# Patient Record
Sex: Female | Born: 1950 | Race: Black or African American | Hispanic: No | Marital: Single | Smoking: Former smoker
Health system: Southern US, Community
[De-identification: ages and names within clinical notes are randomized; demographics above are authoritative.]

## PROBLEM LIST (undated history)

## (undated) DIAGNOSIS — R42 Dizziness and giddiness: Secondary | ICD-10-CM

## (undated) DIAGNOSIS — E119 Type 2 diabetes mellitus without complications: Secondary | ICD-10-CM

## (undated) DIAGNOSIS — I10 Essential (primary) hypertension: Secondary | ICD-10-CM

## (undated) DIAGNOSIS — R062 Wheezing: Secondary | ICD-10-CM

## (undated) DIAGNOSIS — E785 Hyperlipidemia, unspecified: Secondary | ICD-10-CM

## (undated) DIAGNOSIS — J449 Chronic obstructive pulmonary disease, unspecified: Secondary | ICD-10-CM

## (undated) DIAGNOSIS — E079 Disorder of thyroid, unspecified: Secondary | ICD-10-CM

## (undated) DIAGNOSIS — R0600 Dyspnea, unspecified: Secondary | ICD-10-CM

## (undated) DIAGNOSIS — E039 Hypothyroidism, unspecified: Secondary | ICD-10-CM

## (undated) DIAGNOSIS — R011 Cardiac murmur, unspecified: Secondary | ICD-10-CM

## (undated) HISTORY — PX: FRACTURE SURGERY: SHX138

---

## 2016-05-18 ENCOUNTER — Emergency Department
Admission: EM | Admit: 2016-05-18 | Discharge: 2016-05-18 | Disposition: A | Discharge status: Home / Self Care | Payer: BC Managed Care – PPO | Attending: Emergency Medicine | Admitting: Emergency Medicine

## 2016-05-18 ENCOUNTER — Emergency Department: Payer: BC Managed Care – PPO

## 2016-05-18 DIAGNOSIS — R05 Cough: Secondary | ICD-10-CM | POA: Diagnosis present

## 2016-05-18 DIAGNOSIS — H81392 Other peripheral vertigo, left ear: Secondary | ICD-10-CM | POA: Diagnosis not present

## 2016-05-18 DIAGNOSIS — E785 Hyperlipidemia, unspecified: Secondary | ICD-10-CM | POA: Insufficient documentation

## 2016-05-18 DIAGNOSIS — J209 Acute bronchitis, unspecified: Secondary | ICD-10-CM | POA: Diagnosis not present

## 2016-05-18 DIAGNOSIS — I10 Essential (primary) hypertension: Secondary | ICD-10-CM | POA: Insufficient documentation

## 2016-05-18 DIAGNOSIS — E119 Type 2 diabetes mellitus without complications: Secondary | ICD-10-CM | POA: Insufficient documentation

## 2016-05-18 HISTORY — DX: Essential (primary) hypertension: I10

## 2016-05-18 HISTORY — DX: Hyperlipidemia, unspecified: E78.5

## 2016-05-18 HISTORY — DX: Disorder of thyroid, unspecified: E07.9

## 2016-05-18 HISTORY — DX: Type 2 diabetes mellitus without complications: E11.9

## 2016-05-18 LAB — URINALYSIS COMPLETE WITH MICROSCOPIC (ARMC ONLY)
Bilirubin Urine: NEGATIVE
GLUCOSE, UA: NEGATIVE mg/dL
HGB URINE DIPSTICK: NEGATIVE
Ketones, ur: NEGATIVE mg/dL
LEUKOCYTES UA: NEGATIVE
Nitrite: NEGATIVE
Protein, ur: NEGATIVE mg/dL
Specific Gravity, Urine: 1.015 (ref 1.005–1.030)
pH: 7 (ref 5.0–8.0)

## 2016-05-18 LAB — CBC
HEMATOCRIT: 36.8 % (ref 35.0–47.0)
Hemoglobin: 11.9 g/dL — ABNORMAL LOW (ref 12.0–16.0)
MCH: 28.4 pg (ref 26.0–34.0)
MCHC: 32.3 g/dL (ref 32.0–36.0)
MCV: 87.7 fL (ref 80.0–100.0)
Platelets: 173 10*3/uL (ref 150–440)
RBC: 4.2 MIL/uL (ref 3.80–5.20)
RDW: 16.2 % — AB (ref 11.5–14.5)
WBC: 5.6 10*3/uL (ref 3.6–11.0)

## 2016-05-18 LAB — BASIC METABOLIC PANEL
Anion gap: 6 (ref 5–15)
BUN: 25 mg/dL — AB (ref 6–20)
CHLORIDE: 105 mmol/L (ref 101–111)
CO2: 28 mmol/L (ref 22–32)
CREATININE: 0.89 mg/dL (ref 0.44–1.00)
Calcium: 9 mg/dL (ref 8.9–10.3)
GFR calc Af Amer: >60 mL/min (ref >60)
GFR calc non Af Amer: >60 mL/min (ref >60)
GLUCOSE: 110 mg/dL — AB (ref 65–99)
POTASSIUM: 3.9 mmol/L (ref 3.5–5.1)
Sodium: 139 mmol/L (ref 135–145)

## 2016-05-18 MED ORDER — ALBUTEROL SULFATE (2.5 MG/3ML) 0.083% IN NEBU
5.0000 mg | INHALATION_SOLUTION | Freq: Once | RESPIRATORY_TRACT | Status: AC
  Administered 2016-05-18: 5 mg via RESPIRATORY_TRACT
  Filled 2016-05-18: qty 6

## 2016-05-18 MED ORDER — MECLIZINE HCL 25 MG PO TABS
50.0000 mg | ORAL_TABLET | Freq: Once | ORAL | Status: AC
  Administered 2016-05-18: 50 mg via ORAL
  Filled 2016-05-18: qty 2

## 2016-05-18 MED ORDER — IPRATROPIUM-ALBUTEROL 0.5-2.5 (3) MG/3ML IN SOLN
3.0000 mL | Freq: Once | RESPIRATORY_TRACT | Status: AC
  Administered 2016-05-18: 3 mL via RESPIRATORY_TRACT
  Filled 2016-05-18: qty 3

## 2016-05-18 MED ORDER — AZITHROMYCIN 250 MG PO TABS: ORAL_TABLET | ORAL | Status: DC

## 2016-05-18 MED ORDER — MECLIZINE HCL 25 MG PO TABS: 25.0000 mg | ORAL_TABLET | Freq: Three times a day (TID) | ORAL | Status: DC | PRN

## 2016-05-18 MED ORDER — AZITHROMYCIN 500 MG PO TABS
500.0000 mg | ORAL_TABLET | Freq: Once | ORAL | Status: AC
Start: 2016-05-18 — End: 2016-05-18
  Administered 2016-05-18: 500 mg via ORAL
  Filled 2016-05-18: qty 1

## 2016-05-18 MED ORDER — ALBUTEROL SULFATE (2.5 MG/3ML) 0.083% IN NEBU: 2.5000 mg | INHALATION_SOLUTION | Freq: Four times a day (QID) | RESPIRATORY_TRACT | Status: DC | PRN

## 2016-05-18 NOTE — ED Notes (Signed)
Pt c/o cough/congestion for the past week. Today while getting dressed pt states she had 2 episodes of syncope..denies injury.. States she landing on her bed..Marland Kitchen

## 2016-05-18 NOTE — Discharge Instructions (Signed)
Acute Bronchitis Bronchitis is inflammation of the airways that extend from the windpipe into the lungs (bronchi). The inflammation often causes mucus to develop. This leads to a cough, which is the most common symptom of bronchitis.  In acute bronchitis, the condition usually develops suddenly and goes away over time, usually in a couple weeks. Smoking, allergies, and asthma can make bronchitis worse. Repeated episodes of bronchitis may cause further lung problems.  CAUSES Acute bronchitis is most often caused by the same virus that causes a cold. The virus can spread from person to person (contagious) through coughing, sneezing, and touching contaminated objects. SIGNS AND SYMPTOMS   Cough.   Fever.   Coughing up mucus.   Body aches.   Chest congestion.   Chills.   Shortness of breath.   Sore throat.  DIAGNOSIS  Acute bronchitis is usually diagnosed through a physical exam. Your health care provider will also ask you questions about your medical history. Tests, such as chest X-rays, are sometimes done to rule out other conditions.  TREATMENT  Acute bronchitis usually goes away in a couple weeks. Oftentimes, no medical treatment is necessary. Medicines are sometimes given for relief of fever or cough. Antibiotic medicines are usually not needed but may be prescribed in certain situations. In some cases, an inhaler may be recommended to help reduce shortness of breath and control the cough. A cool mist vaporizer may also be used to help thin bronchial secretions and make it easier to clear the chest.  HOME CARE INSTRUCTIONS  Get plenty of rest.   Drink enough fluids to keep your urine clear or pale yellow (unless you have a medical condition that requires fluid restriction). Increasing fluids may help thin your respiratory secretions (sputum) and reduce chest congestion, and it will prevent dehydration.   Take medicines only as directed by your health care provider.  If  you were prescribed an antibiotic medicine, finish it all even if you start to feel better.  Avoid smoking and secondhand smoke. Exposure to cigarette smoke or irritating chemicals will make bronchitis worse. If you are a smoker, consider using nicotine gum or skin patches to help control withdrawal symptoms. Quitting smoking will help your lungs heal faster.   Reduce the chances of another bout of acute bronchitis by washing your hands frequently, avoiding people with cold symptoms, and trying not to touch your hands to your mouth, nose, or eyes.   Keep all follow-up visits as directed by your health care provider.  SEEK MEDICAL CARE IF: Your symptoms do not improve after 1 week of treatment.  SEEK IMMEDIATE MEDICAL CARE IF:  You develop an increased fever or chills.   You have chest pain.   You have severe shortness of breath.  You have bloody sputum.   You develop dehydration.  You faint or repeatedly feel like you are going to pass out.  You develop repeated vomiting.  You develop a severe headache. MAKE SURE YOU:   Understand these instructions.  Will watch your condition.  Will get help right away if you are not doing well or get worse.   This information is not intended to replace advice given to you by your health care provider. Make sure you discuss any questions you have with your health care provider.   Document Released: 12/23/2004 Document Revised: 12/06/2014 Document Reviewed: 05/08/2013 Elsevier Interactive Patient Education 2016 Elsevier Inc.  Benign Positional Vertigo Vertigo is the feeling that you or your surroundings are moving when they are not.  Benign positional vertigo is the most common form of vertigo. The cause of this condition is not serious (is benign). This condition is triggered by certain movements and positions (is positional). This condition can be dangerous if it occurs while you are doing something that could endanger you or others,  such as driving.  CAUSES In many cases, the cause of this condition is not known. It may be caused by a disturbance in an area of the inner ear that helps your brain to sense movement and balance. This disturbance can be caused by a viral infection (labyrinthitis), head injury, or repetitive motion. RISK FACTORS This condition is more likely to develop in:  Women.  People who are 64 years of age or older. SYMPTOMS Symptoms of this condition usually happen when you move your head or your eyes in different directions. Symptoms may start suddenly, and they usually last for less than a minute. Symptoms may include:  Loss of balance and falling.  Feeling like you are spinning or moving.  Feeling like your surroundings are spinning or moving.  Nausea and vomiting.  Blurred vision.  Dizziness.  Involuntary eye movement (nystagmus). Symptoms can be mild and cause only slight annoyance, or they can be severe and interfere with daily life. Episodes of benign positional vertigo may return (recur) over time, and they may be triggered by certain movements. Symptoms may improve over time. DIAGNOSIS This condition is usually diagnosed by medical history and a physical exam of the head, neck, and ears. You may be referred to a health care provider who specializes in ear, nose, and throat (ENT) problems (otolaryngologist) or a provider who specializes in disorders of the nervous system (neurologist). You may have additional testing, including:  MRI.  A CT scan.  Eye movement tests. Your health care provider may ask you to change positions quickly while he or she watches you for symptoms of benign positional vertigo, such as nystagmus. Eye movement may be tested with an electronystagmogram (ENG), caloric stimulation, the Dix-Hallpike test, or the roll test.  An electroencephalogram (EEG). This records electrical activity in your brain.  Hearing tests. TREATMENT Usually, your health care provider  will treat this by moving your head in specific positions to adjust your inner ear back to normal. Surgery may be needed in severe cases, but this is rare. In some cases, benign positional vertigo may resolve on its own in 2-4 weeks. HOME CARE INSTRUCTIONS Safety  Move slowly.Avoid sudden body or head movements.  Avoid driving.  Avoid operating heavy machinery.  Avoid doing any tasks that would be dangerous to you or others if a vertigo episode would occur.  If you have trouble walking or keeping your balance, try using a cane for stability. If you feel dizzy or unstable, sit down right away.  Return to your normal activities as told by your health care provider. Ask your health care provider what activities are safe for you. General Instructions  Take over-the-counter and prescription medicines only as told by your health care provider.  Avoid certain positions or movements as told by your health care provider.  Drink enough fluid to keep your urine clear or pale yellow.  Keep all follow-up visits as told by your health care provider. This is important. SEEK MEDICAL CARE IF:  You have a fever.  Your condition gets worse or you develop new symptoms.  Your family or friends notice any behavioral changes.  Your nausea or vomiting gets worse.  You have numbness or a "  pins and needles" sensation. SEEK IMMEDIATE MEDICAL CARE IF:  You have difficulty speaking or moving.  You are always dizzy.  You faint.  You develop severe headaches.  You have weakness in your legs or arms.  You have changes in your hearing or vision.  You develop a stiff neck.  You develop sensitivity to light.   This information is not intended to replace advice given to you by your health care provider. Make sure you discuss any questions you have with your health care provider.   Document Released: 08/23/2006 Document Revised: 08/06/2015 Document Reviewed: 03/10/2015 Elsevier Interactive  Patient Education 2016 Elsevier Inc.  Dizziness Dizziness is a common problem. It is a feeling of unsteadiness or light-headedness. You may feel like you are about to faint. Dizziness can lead to injury if you stumble or fall. Anyone can become dizzy, but dizziness is more common in older adults. This condition can be caused by a number of things, including medicines, dehydration, or illness. HOME CARE INSTRUCTIONS Taking these steps may help with your condition: Eating and Drinking  Drink enough fluid to keep your urine clear or pale yellow. This helps to keep you from becoming dehydrated. Try to drink more clear fluids, such as water.  Do not drink alcohol.  Limit your caffeine intake if directed by your health care provider.  Limit your salt intake if directed by your health care provider. Activity  Avoid making quick movements.  Rise slowly from chairs and steady yourself until you feel okay.  In the morning, first sit up on the side of the bed. When you feel okay, stand slowly while you hold onto something until you know that your balance is fine.  Move your legs often if you need to stand in one place for a long time. Tighten and relax your muscles in your legs while you are standing.  Do not drive or operate heavy machinery if you feel dizzy.  Avoid bending down if you feel dizzy. Place items in your home so that they are easy for you to reach without leaning over. Lifestyle  Do not use any tobacco products, including cigarettes, chewing tobacco, or electronic cigarettes. If you need help quitting, ask your health care provider.  Try to reduce your stress level, such as with yoga or meditation. Talk with your health care provider if you need help. General Instructions  Watch your dizziness for any changes.  Take medicines only as directed by your health care provider. Talk with your health care provider if you think that your dizziness is caused by a medicine that you are  taking.  Tell a friend or a family member that you are feeling dizzy. If he or she notices any changes in your behavior, have this person call your health care provider.  Keep all follow-up visits as directed by your health care provider. This is important. SEEK MEDICAL CARE IF:  Your dizziness does not go away.  Your dizziness or light-headedness gets worse.  You feel nauseous.  You have reduced hearing.  You have new symptoms.  You are unsteady on your feet or you feel like the room is spinning. SEEK IMMEDIATE MEDICAL CARE IF:  You vomit or have diarrhea and are unable to eat or drink anything.  You have problems talking, walking, swallowing, or using your arms, hands, or legs.  You feel generally weak.  You are not thinking clearly or you have trouble forming sentences. It may take a friend or family member to  notice this.  You have chest pain, abdominal pain, shortness of breath, or sweating.  Your vision changes.  You notice any bleeding.  You have a headache.  You have neck pain or a stiff neck.  You have a fever.   This information is not intended to replace advice given to you by your health care provider. Make sure you discuss any questions you have with your health care provider.   Document Released: 05/11/2001 Document Revised: 04/01/2015 Document Reviewed: 11/11/2014 Elsevier Interactive Patient Education [employer] Elsevier Inc.

## 2016-05-18 NOTE — ED Notes (Signed)
MD at bedside. 

## 2016-05-18 NOTE — ED Provider Notes (Signed)
[facility] Emergency Department Provider Note  ____________________________________________  Time seen: 4:00 PM  I have reviewed the triage vital signs and the nursing notes.   HISTORY  Chief Complaint Loss of Consciousness    HPI Erica Camacho is a 65 y.o. female who complains of nonproductive cough and nasal congestion for the past week. Also has decreased energy level and body aches denies chest pain. No fever or chills or night sweats. No recent travel trauma hospitalizations or surgeries. No new headache or neck pain or stiffness.  Today while standing up getting dressed, she had 2 episodes of passing out. They're brief and she fell onto her bed without any apparent injuries.     Past Medical History  Diagnosis Date  . Hypertension   . Thyroid disease   . Diabetes mellitus without complication (HCC)   . Hyperlipidemia      There are no active problems to display for this patient.    Past Surgical History  Procedure Laterality Date  . Fracture surgery       Current Outpatient Rx  Name  Route  Sig  Dispense  Refill  . albuterol (PROVENTIL) (2.5 MG/3ML) 0.083% nebulizer solution   Nebulization   Take 3 mLs (2.5 mg total) by nebulization every 6 (six) hours as needed for wheezing or shortness of breath.   75 mL   0   . azithromycin (ZITHROMAX Z-PAK) 250 MG tablet      Take 2 tablets (500 mg) on  Day 1,  followed by 1 tablet (250 mg) once daily on Days 2 through 5.   6 each   0   . meclizine (ANTIVERT) 25 MG tablet   Oral   Take 1 tablet (25 mg total) by mouth 3 (three) times daily as needed for dizziness or nausea.   30 tablet   1      Allergies Ampicillin; Carrot; and Penicillins   No family history on file.  Social History Social History  Substance Use Topics  . Smoking status: Never Smoker   . Smokeless tobacco: None  . Alcohol Use: No    Review of Systems  Constitutional:   No fever or chills.  Eyes:    No vision changes.  ENT:   No sore throat. No rhinorrhea. Cardiovascular:   No chest pain. Respiratory:   No dyspnea or cough. Gastrointestinal:   Negative for abdominal pain, vomiting and diarrhea.  Genitourinary:   Negative for dysuria or difficulty urinating. Musculoskeletal:   Negative for focal pain or swelling Neurological:   Positive for chronic intermittent headache over the past month, unchanged 10-point ROS otherwise negative.  ____________________________________________   PHYSICAL EXAM:  VITAL SIGNS: ED Triage Vitals  Enc Vitals Group     BP 05/18/16 1216 160/66 mmHg     Pulse Rate 05/18/16 1216 76     Resp 05/18/16 1216 17     Temp 05/18/16 1216 98.6 F (37 C)     Temp Source 05/18/16 1216 Oral     SpO2 05/18/16 1216 98 %     Weight 05/18/16 1216 190 lb (86.183 kg)     Height 05/18/16 1216  (1.549 m)     Head Cir --      Peak Flow --      Pain Score 05/18/16 1217 5     Pain Loc --      Pain Edu? --      Excl. in GC? --     Vital  signs reviewed, nursing assessments reviewed.   Constitutional:   Alert and oriented. Well appearing and in no distress. Eyes:   No scleral icterus. No conjunctival pallor. PERRL. EOMI.  Positive left beating nystagmus, extinguishes after 8-10 beats. ENT   Head:   Normocephalic and atraumatic.   Nose:   Positive nasal congestion. No septal hematoma   Mouth/Throat:   MMM, mild pharyngeal erythema. No peritonsillar mass.    Neck:   No stridor. No SubQ emphysema. No meningismus. Hematological/Lymphatic/Immunilogical:   No cervical lymphadenopathy. Cardiovascular:   RRR. Symmetric bilateral radial and DP pulses.  No murmurs.  Respiratory:   Diffuse expiratory wheezing without focal consolidative findings. Symmetric breath sounds in all lung fields. No expiratory phase Gastrointestinal:   Soft and nontender. Non distended. There is no CVA tenderness.  No rebound, rigidity, or guarding. Genitourinary:    deferred Musculoskeletal:   Nontender with normal range of motion in all extremities. No joint effusions.  No lower extremity tenderness.  No edema. Neurologic:   Normal speech and language.  HINTS exam consistent with peripheral vertigo related to left side CN 2-10 normal. Motor grossly intact. No gross focal neurologic deficits are appreciated.  Skin:    Skin is warm, dry and intact. No rash noted.  No petechiae, purpura, or bullae.  ____________________________________________    LABS (pertinent positives/negatives) (all labs ordered are listed, but only abnormal results are displayed) Labs Reviewed  BASIC METABOLIC PANEL - Abnormal; Notable for the following:    Glucose, Bld 110 (*)    BUN 25 (*)    All other components within normal limits  CBC - Abnormal; Notable for the following:    Hemoglobin 11.9 (*)    RDW 16.2 (*)    All other components within normal limits  URINALYSIS COMPLETEWITH MICROSCOPIC ([facility] ONLY) - Abnormal; Notable for the following:    Color, Urine YELLOW (*)    APPearance CLEAR (*)    Bacteria, UA RARE (*)    Squamous Epithelial / LPF 0-5 (*)    All other components within normal limits   ____________________________________________   EKG  Interpreted by me Normal sinus rhythm rate of 71, normal axis intervals. Poor R-wave progression in interpreted as normal ST segment and T waves  ____________________________________________    RADIOLOGY  Chest x-ray unremarkable  ____________________________________________   PROCEDURES   ____________________________________________   INITIAL IMPRESSION / ASSESSMENT AND PLAN / ED COURSE  Pertinent labs & imaging results that were available during my care of the patient were reviewed by me and considered in my medical decision making (see chart for details).  Patient well appearing no acute distress. Vital signs unremarkable although she is moderately hypertensive on initial evaluation. With  further monitoring in the ED her blood pressure returns to approximate normal limits. Family member comparing the patient requests a CT scan of the head, but this does not appear to be warranted at this time. She has no thunderclap or severe headache associated with it to suggest stroke or intracranial hemorrhage, no evidence of meningitis or encephalitis or other neurologic or vascular phenomenon. This all appears to be due to a viral illness which is likely causing some labyrinthitis and peripheral vertigo as well as reactive airway disease with the wheezing. Patient does feel better after receiving nebulizer treatments in the emergency department. We will hold off on giving any steroids due to her oral medication dependent diabetes. I prescribed her meclizine albuterol and azithromycin. Patient is ambulatory and tolerating oral intake. Labs unremarkable.  ____________________________________________   FINAL CLINICAL IMPRESSION(S) / ED DIAGNOSES  Final diagnoses:  Peripheral vertigo, left  Acute bronchitis, unspecified organism       Portions of this note were generated with dragon dictation software. Dictation errors may occur despite best attempts at proofreading.   Sharman Cheek, MD 05/18/16 [phone]

## 2016-05-18 NOTE — ED Notes (Signed)
Patient denies chest pain or SOB.

## 2016-05-18 NOTE — ED Notes (Signed)
Patient transported to X-ray 

## 2018-09-25 ENCOUNTER — Encounter: Payer: Self-pay | Admitting: Emergency Medicine

## 2018-09-25 ENCOUNTER — Emergency Department
Admission: EM | Admit: 2018-09-25 | Discharge: 2018-09-25 | Disposition: A | Discharge status: Home / Self Care | Payer: Medicare Other | Attending: Emergency Medicine | Admitting: Emergency Medicine

## 2018-09-25 ENCOUNTER — Encounter: Payer: Self-pay | Admitting: *Deleted

## 2018-09-25 ENCOUNTER — Emergency Department: Payer: Medicare Other

## 2018-09-25 DIAGNOSIS — E119 Type 2 diabetes mellitus without complications: Secondary | ICD-10-CM | POA: Insufficient documentation

## 2018-09-25 DIAGNOSIS — E039 Hypothyroidism, unspecified: Secondary | ICD-10-CM | POA: Insufficient documentation

## 2018-09-25 DIAGNOSIS — I10 Essential (primary) hypertension: Secondary | ICD-10-CM | POA: Diagnosis not present

## 2018-09-25 DIAGNOSIS — Z91040 Latex allergy status: Secondary | ICD-10-CM | POA: Diagnosis not present

## 2018-09-25 DIAGNOSIS — R05 Cough: Secondary | ICD-10-CM

## 2018-09-25 DIAGNOSIS — Z7984 Long term (current) use of oral hypoglycemic drugs: Secondary | ICD-10-CM | POA: Insufficient documentation

## 2018-09-25 DIAGNOSIS — Z79899 Other long term (current) drug therapy: Secondary | ICD-10-CM | POA: Insufficient documentation

## 2018-09-25 DIAGNOSIS — J45901 Unspecified asthma with (acute) exacerbation: Secondary | ICD-10-CM | POA: Diagnosis not present

## 2018-09-25 DIAGNOSIS — Z87891 Personal history of nicotine dependence: Secondary | ICD-10-CM | POA: Insufficient documentation

## 2018-09-25 DIAGNOSIS — Z7982 Long term (current) use of aspirin: Secondary | ICD-10-CM | POA: Diagnosis not present

## 2018-09-25 DIAGNOSIS — R059 Cough, unspecified: Secondary | ICD-10-CM

## 2018-09-25 LAB — BASIC METABOLIC PANEL
Anion gap: 7 (ref 5–15)
BUN: 28 mg/dL — AB (ref 8–23)
CALCIUM: 9.5 mg/dL (ref 8.9–10.3)
CO2: 28 mmol/L (ref 22–32)
CREATININE: 1.21 mg/dL — AB (ref 0.44–1.00)
Chloride: 106 mmol/L (ref 98–111)
GFR calc Af Amer: 53 mL/min — ABNORMAL LOW (ref >60)
GFR, EST NON AFRICAN AMERICAN: 46 mL/min — AB (ref >60)
Glucose, Bld: 145 mg/dL — ABNORMAL HIGH (ref 70–99)
Potassium: 3.5 mmol/L (ref 3.5–5.1)
SODIUM: 141 mmol/L (ref 135–145)

## 2018-09-25 LAB — CBC
HEMATOCRIT: 35.3 % — AB (ref 36.0–46.0)
Hemoglobin: 10.8 g/dL — ABNORMAL LOW (ref 12.0–15.0)
MCH: 27.6 pg (ref 26.0–34.0)
MCHC: 30.6 g/dL (ref 30.0–36.0)
MCV: 90.3 fL (ref 80.0–100.0)
Platelets: 260 10*3/uL (ref 150–400)
RBC: 3.91 MIL/uL (ref 3.87–5.11)
RDW: 14 % (ref 11.5–15.5)
WBC: 7.3 10*3/uL (ref 4.0–10.5)
nRBC: 0 % (ref 0.0–0.2)

## 2018-09-25 LAB — TROPONIN I

## 2018-09-25 MED ORDER — ALBUTEROL SULFATE HFA 108 (90 BASE) MCG/ACT IN AERS: 2.0000 | INHALATION_SPRAY | Freq: Four times a day (QID) | RESPIRATORY_TRACT | 0 refills | Status: DC | PRN

## 2018-09-25 MED ORDER — ALBUTEROL SULFATE (2.5 MG/3ML) 0.083% IN NEBU: 2.5000 mg | INHALATION_SOLUTION | Freq: Four times a day (QID) | RESPIRATORY_TRACT | 12 refills | Status: DC | PRN

## 2018-09-25 MED ORDER — IPRATROPIUM-ALBUTEROL 0.5-2.5 (3) MG/3ML IN SOLN
3.0000 mL | Freq: Once | RESPIRATORY_TRACT | Status: AC
  Administered 2018-09-25: 3 mL via RESPIRATORY_TRACT
  Filled 2018-09-25: qty 3

## 2018-09-25 MED ORDER — BENZONATATE 100 MG PO CAPS: 100.0000 mg | ORAL_CAPSULE | Freq: Four times a day (QID) | ORAL | 0 refills | Status: AC | PRN

## 2018-09-25 MED ORDER — PREDNISONE 10 MG (21) PO TBPK: ORAL_TABLET | ORAL | 0 refills | Status: DC

## 2018-09-25 NOTE — ED Triage Notes (Signed)
Says cough for 1 month.  Says couging up blood.  Mask on.

## 2018-09-25 NOTE — ED Triage Notes (Signed)
Pt reports she has had cough x1 month without relief of OTC meds. Pt was informed by her PCP x1 year ago that she "may" have COPD. Pt had one episode this AM of bloody sputum with cough.

## 2018-09-25 NOTE — ED Provider Notes (Signed)
[facility] Emergency Department Provider Note  ____________________________________________   I have reviewed the triage vital signs and the nursing notes.   HISTORY  Chief Complaint Cough  History limited by: Not Limited   HPI Erica Camacho is a 67 y.o. female who presents to the emergency department today with chief complaint of cough.  Patient's cough is been present for roughly 1 month.  Has been associated with some shortness of breath.  Patient states that she has become more concerned recently because of some bloody cough.  The symptoms have occurred in the past.  The patient states that she has a history of asthma however ran out of her medication quite some time ago.  She denies any recent known sick contacts.  She denies any fevers.  She states she did have cold-like symptoms prior to the coughing starting.   Per medical record review patient has a history of DM, wheezing, HTN, HLD.   Past Medical History:  Diagnosis Date  . Diabetes mellitus without complication (HCC)   . Heart murmur   . Hyperlipidemia   . Hypertension   . Hypothyroidism   . Thyroid disease   . Vertigo   . Wheezing    OCCAS    There are no active problems to display for this patient.   Past Surgical History:  Procedure Laterality Date  . CESAREAN SECTION     X 3  . FRACTURE SURGERY      Prior to Admission medications   Medication Sig Start Date End Date Taking? Authorizing Provider  acetaminophen (TYLENOL) 500 MG tablet Take 500 mg by mouth daily as needed for moderate pain or headache.    [provider]  albuterol (PROVENTIL) (2.5 MG/3ML) 0.083% nebulizer solution Take 3 mLs (2.5 mg total) by nebulization every 6 (six) hours as needed for wheezing or shortness of breath. 05/18/16   Sharman Cheek, MD  aspirin EC 81 MG tablet Take 81 mg by mouth daily.    [provider]  atorvastatin (LIPITOR) 40 MG tablet Take 40 mg by mouth daily.     [provider]  Cholecalciferol (VITAMIN D3 PO) Take 1 capsule by mouth daily.    [provider]  glimepiride (AMARYL) 4 MG tablet Take 4 mg by mouth daily.    [provider]  levothyroxine (SYNTHROID, LEVOTHROID) 75 MCG tablet Take 75 mcg by mouth daily before breakfast.    [provider]  losartan (COZAAR) 100 MG tablet Take 100 mg by mouth daily.    [provider]  Multiple Vitamins-Minerals (EMERGEN-C IMMUNE) PACK Take 1 packet by mouth daily as needed (immune support).    [provider]  sitaGLIPtin (JANUVIA) 100 MG tablet Take 100 mg by mouth daily.    [provider]  Tetrahydrozoline HCl (VISINE OP) Place 1 drop into both eyes daily as needed (dry eyes).    [provider]  triamterene-hydrochlorothiazide (MAXZIDE-25) 37.5-25 MG tablet Take 1 tablet by mouth daily. 07/13/18   [provider]    Allergies Aspirin; Ampicillin; Carrot [daucus carota]; Codeine; Latex; and Penicillins  History reviewed. No pertinent family history.  Social History Social History   Tobacco Use  . Smoking status: Former Games developer  . Smokeless tobacco: Never Used  Substance Use Topics  . Alcohol use: No  . Drug use: No    Review of Systems Constitutional: No fever/chills Eyes: No visual changes. ENT: No sore throat. Cardiovascular: Denies chest pain. Respiratory: Positive for cough and shortness  of breath. Gastrointestinal: No abdominal pain.  No nausea, no vomiting.  No diarrhea.   Genitourinary: Negative for dysuria. Musculoskeletal: Negative for back pain. Skin: Negative for rash. Neurological: Negative for headaches, focal weakness or numbness.  ____________________________________________   PHYSICAL EXAM:  VITAL SIGNS: ED Triage Vitals [09/25/18 1325]  Enc Vitals Group     BP (!) 164/72     Pulse Rate 76     Resp (!) 2     Temp 98.7 F (37.1 C)     Temp Source Oral     SpO2 99 %     Weight 190  lb (86.2 kg)     Height      Head Circumference      Peak Flow      Pain Score 0   Constitutional: Alert and oriented.  Eyes: Conjunctivae are normal.  ENT      Head: Normocephalic and atraumatic.      Nose: No congestion/rhinnorhea.      Mouth/Throat: Mucous membranes are moist.      Neck: No stridor. Hematological/Lymphatic/Immunilogical: No cervical lymphadenopathy. Cardiovascular: Normal rate, regular rhythm.  No murmurs, rubs, or gallops.  Respiratory: Frequent dry cough. Diffuse expiratory wheezing.  Gastrointestinal: Soft and non tender. No rebound. No guarding.  Genitourinary: Deferred Musculoskeletal: Normal range of motion in all extremities. No lower extremity edema. Neurologic:  Normal speech and language. No gross focal neurologic deficits are appreciated.  Skin:  Skin is warm, dry and intact. No rash noted. Psychiatric: Mood and affect are normal. Speech and behavior are normal. Patient exhibits appropriate insight and judgment.  ____________________________________________    LABS (pertinent positives/negatives)  Trop <0.03 CBC wbc 7.3, hgb 10.8, plt 260 BMP na 141, k 3.5, glu 145, cr 1.21  ____________________________________________   EKG  I, Phineas Semen, attending physician, personally viewed and interpreted this EKG  EKG Time: 1325 Rate: 73 Rhythm: normal sinus rhythm Axis: normal Intervals: qtc 425 QRS: narrow, q waves v1 ST changes: no st elevation Impression: abnormal ekg   ____________________________________________    RADIOLOGY  CXR No edema or consolidation  ____________________________________________   PROCEDURES  Procedures  ____________________________________________   INITIAL IMPRESSION / ASSESSMENT AND PLAN / ED COURSE  Pertinent labs & imaging results that were available during my care of the patient were reviewed by me and considered in my medical decision making (see chart for details).   Patient presented  to the emergency department today because of concerns for cough and shortness of breath.  Patient states she has a history of asthma and has not had her medications.  On exam she has frequent dry cough as well as diffuse expiratory wheezing.  At this point I think bronchitis/asthma exacerbation likely.  Chest x-ray without any pneumonia.  Patient states that she received a breathing treatment prior to my evaluation which did help some.  Did offer to give further breathing treatments and steroids here in the emergency department however patient declined stating that she had to go.  Did discuss with the patient that we will prescribe cough medications, steroids and breathing treatments.  ____________________________________________   FINAL CLINICAL IMPRESSION(S) / ED DIAGNOSES  Final diagnoses:  Exacerbation of asthma, unspecified asthma severity, unspecified whether persistent  Cough     Note: This dictation was prepared with Dragon dictation. Any transcriptional errors that result from this process are unintentional     Phineas Semen, MD 09/25/18 1742

## 2018-09-25 NOTE — ED Notes (Signed)

## 2018-09-25 NOTE — Discharge Instructions (Addendum)
Please seek medical attention for any high fevers, chest pain, shortness of breath, change in behavior, persistent vomiting, bloody stool or any other new or concerning symptoms.  

## 2018-09-27 ENCOUNTER — Encounter: Payer: Self-pay | Admitting: Anesthesiology

## 2018-09-27 ENCOUNTER — Ambulatory Visit: Payer: Medicare Other | Admitting: Anesthesiology

## 2018-09-28 ENCOUNTER — Encounter: Payer: Self-pay | Admitting: *Deleted

## 2018-09-28 ENCOUNTER — Encounter
Admission: RE | Disposition: A | Discharge status: Home / Self Care | Payer: Self-pay | Source: Ambulatory Visit | Attending: Ophthalmology

## 2018-09-28 ENCOUNTER — Other Ambulatory Visit: Payer: Self-pay

## 2018-09-28 ENCOUNTER — Ambulatory Visit
Admission: RE | Admit: 2018-09-28 | Discharge: 2018-09-28 | Disposition: A | Discharge status: Home / Self Care | Payer: Medicare Other | Source: Ambulatory Visit | Attending: Ophthalmology | Admitting: Ophthalmology

## 2018-09-28 DIAGNOSIS — Z5309 Procedure and treatment not carried out because of other contraindication: Secondary | ICD-10-CM | POA: Insufficient documentation

## 2018-09-28 DIAGNOSIS — Z87891 Personal history of nicotine dependence: Secondary | ICD-10-CM | POA: Diagnosis not present

## 2018-09-28 DIAGNOSIS — H269 Unspecified cataract: Secondary | ICD-10-CM | POA: Diagnosis not present

## 2018-09-28 DIAGNOSIS — I10 Essential (primary) hypertension: Secondary | ICD-10-CM | POA: Insufficient documentation

## 2018-09-28 DIAGNOSIS — E119 Type 2 diabetes mellitus without complications: Secondary | ICD-10-CM | POA: Diagnosis not present

## 2018-09-28 HISTORY — DX: Hypothyroidism, unspecified: E03.9

## 2018-09-28 HISTORY — DX: Cardiac murmur, unspecified: R01.1

## 2018-09-28 HISTORY — DX: Dizziness and giddiness: R42

## 2018-09-28 HISTORY — DX: Wheezing: R06.2

## 2018-09-28 LAB — GLUCOSE, CAPILLARY: GLUCOSE-CAPILLARY: 169 mg/dL — AB (ref 70–99)

## 2018-09-28 SURGERY — PHACOEMULSIFICATION, CATARACT, WITH IOL INSERTION
Anesthesia: Monitor Anesthesia Care | Laterality: Left

## 2018-09-28 MED ORDER — ARMC OPHTHALMIC DILATING DROPS: 1.0000 "application " | OPHTHALMIC | Status: AC

## 2018-09-28 MED ORDER — SODIUM CHLORIDE 0.9 % IV SOLN: INTRAVENOUS | Status: DC

## 2018-09-28 MED ORDER — MOXIFLOXACIN HCL 0.5 % OP SOLN
OPHTHALMIC | Status: AC
  Filled 2018-09-28: qty 3

## 2018-09-28 MED ORDER — MOXIFLOXACIN HCL 0.5 % OP SOLN: 1.0000 [drp] | OPHTHALMIC | Status: DC | PRN

## 2018-09-28 MED ORDER — TETRACAINE HCL 0.5 % OP SOLN: 1.0000 [drp] | OPHTHALMIC | Status: DC | PRN

## 2018-09-28 MED ORDER — TETRACAINE HCL 0.5 % OP SOLN
OPHTHALMIC | Status: AC
  Filled 2018-09-28: qty 4

## 2018-09-28 MED ORDER — ARMC OPHTHALMIC DILATING DROPS
OPHTHALMIC | Status: AC
  Filled 2018-09-28: qty 0.5

## 2018-09-28 SURGICAL SUPPLY — 16 items
DISSECTOR HYDRO NUCLEUS 50X22 (MISCELLANEOUS) ×8 IMPLANT
GLOVE BIOGEL M 6.5 STRL (GLOVE) ×2 IMPLANT
GOWN STRL REUS W/ TWL LRG LVL3 (GOWN DISPOSABLE) ×1 IMPLANT
GOWN STRL REUS W/ TWL XL LVL3 (GOWN DISPOSABLE) ×1 IMPLANT
GOWN STRL REUS W/TWL LRG LVL3 (GOWN DISPOSABLE) ×1
GOWN STRL REUS W/TWL XL LVL3 (GOWN DISPOSABLE) ×1
KNIFE 45D UP 2.3 (MISCELLANEOUS) ×2 IMPLANT
LABEL CATARACT MEDS ST (LABEL) ×2 IMPLANT
PACK CATARACT (MISCELLANEOUS) ×2 IMPLANT
PACK CATARACT KING (MISCELLANEOUS) ×2 IMPLANT
PACK EYE AFTER SURG (MISCELLANEOUS) ×2 IMPLANT
SOL BSS BAG (MISCELLANEOUS) ×2
SOLUTION BSS BAG (MISCELLANEOUS) ×1 IMPLANT
SYR 5ML LL (SYRINGE) ×2 IMPLANT
WATER STERILE IRR 250ML POUR (IV SOLUTION) ×2 IMPLANT
WIPE NON LINTING 3.25X3.25 (MISCELLANEOUS) ×2 IMPLANT

## 2018-09-28 NOTE — OR Nursing (Signed)
Patient ate 1/2 of a banana this morning at 5 am. Dr. Maisie Fus notified and he reports patient would have to be NPO for eight hours. Pt. Notified and she has decided to reschedule.

## 2018-09-28 NOTE — Anesthesia Preprocedure Evaluation (Deleted)
Anesthesia Evaluation  Patient identified by MRN, date of birth, ID band Patient awake    Reviewed: Allergy & Precautions, NPO status , Patient's Chart, lab work & pertinent test results, reviewed documented beta blocker date and time   Airway Mallampati: III  TM Distance: >3 FB     Dental  (+) Chipped   Pulmonary former smoker,           Cardiovascular hypertension, Pt. on medications      Neuro/Psych    GI/Hepatic   Endo/Other  diabetes, Type 2Hypothyroidism   Renal/GU      Musculoskeletal   Abdominal   Peds  Hematology   Anesthesia Other Findings Obese. CXR ok. EKG shows poor R waves, otherwise ok. No cardiac symptoms.  Reproductive/Obstetrics                             Anesthesia Physical Anesthesia Plan  ASA: III  Anesthesia Plan: MAC   Post-op Pain Management:    Induction:   PONV Risk Score and Plan:   Airway Management Planned:   Additional Equipment:   Intra-op Plan:   Post-operative Plan:   Informed Consent: I have reviewed the patients History and Physical, chart, labs and discussed the procedure including the risks, benefits and alternatives for the proposed anesthesia with the patient or authorized representative who has indicated his/her understanding and acceptance.     Plan Discussed with: CRNA  Anesthesia Plan Comments:         Anesthesia Quick Evaluation

## 2018-10-05 ENCOUNTER — Encounter: Payer: Self-pay | Admitting: *Deleted

## 2018-10-05 ENCOUNTER — Ambulatory Visit: Payer: Medicare Other | Admitting: Anesthesiology

## 2018-10-05 ENCOUNTER — Encounter
Admission: RE | Disposition: A | Discharge status: Home / Self Care | Payer: Self-pay | Source: Ambulatory Visit | Attending: Ophthalmology

## 2018-10-05 ENCOUNTER — Ambulatory Visit
Admission: RE | Admit: 2018-10-05 | Discharge: 2018-10-05 | Disposition: A | Discharge status: Home / Self Care | Payer: Medicare Other | Source: Ambulatory Visit | Attending: Ophthalmology | Admitting: Ophthalmology

## 2018-10-05 ENCOUNTER — Other Ambulatory Visit: Payer: Self-pay

## 2018-10-05 DIAGNOSIS — E1136 Type 2 diabetes mellitus with diabetic cataract: Secondary | ICD-10-CM | POA: Diagnosis present

## 2018-10-05 DIAGNOSIS — Z79899 Other long term (current) drug therapy: Secondary | ICD-10-CM | POA: Insufficient documentation

## 2018-10-05 DIAGNOSIS — H2512 Age-related nuclear cataract, left eye: Secondary | ICD-10-CM | POA: Insufficient documentation

## 2018-10-05 DIAGNOSIS — Z87891 Personal history of nicotine dependence: Secondary | ICD-10-CM | POA: Insufficient documentation

## 2018-10-05 DIAGNOSIS — Z7984 Long term (current) use of oral hypoglycemic drugs: Secondary | ICD-10-CM | POA: Diagnosis not present

## 2018-10-05 DIAGNOSIS — I10 Essential (primary) hypertension: Secondary | ICD-10-CM | POA: Insufficient documentation

## 2018-10-05 DIAGNOSIS — Z7982 Long term (current) use of aspirin: Secondary | ICD-10-CM | POA: Diagnosis not present

## 2018-10-05 DIAGNOSIS — Z880 Allergy status to penicillin: Secondary | ICD-10-CM | POA: Insufficient documentation

## 2018-10-05 DIAGNOSIS — Z885 Allergy status to narcotic agent status: Secondary | ICD-10-CM | POA: Diagnosis not present

## 2018-10-05 DIAGNOSIS — E039 Hypothyroidism, unspecified: Secondary | ICD-10-CM | POA: Insufficient documentation

## 2018-10-05 DIAGNOSIS — Z79890 Hormone replacement therapy (postmenopausal): Secondary | ICD-10-CM | POA: Insufficient documentation

## 2018-10-05 DIAGNOSIS — E785 Hyperlipidemia, unspecified: Secondary | ICD-10-CM | POA: Insufficient documentation

## 2018-10-05 HISTORY — PX: CATARACT EXTRACTION W/PHACO: SHX586

## 2018-10-05 LAB — GLUCOSE, CAPILLARY: GLUCOSE-CAPILLARY: 126 mg/dL — AB (ref 70–99)

## 2018-10-05 SURGERY — PHACOEMULSIFICATION, CATARACT, WITH IOL INSERTION
Anesthesia: Monitor Anesthesia Care | Site: Eye | Laterality: Left

## 2018-10-05 MED ORDER — ARMC OPHTHALMIC DILATING DROPS
1.0000 "application " | OPHTHALMIC | Status: AC | PRN
  Administered 2018-10-05 (×3): 1 via OPHTHALMIC

## 2018-10-05 MED ORDER — SODIUM CHLORIDE 0.9 % IV SOLN
INTRAVENOUS | Status: DC
  Administered 2018-10-05: 07:00:00 via INTRAVENOUS

## 2018-10-05 MED ORDER — MIDAZOLAM HCL 2 MG/2ML IJ SOLN
INTRAMUSCULAR | Status: AC
  Filled 2018-10-05: qty 2

## 2018-10-05 MED ORDER — TETRACAINE HCL 0.5 % OP SOLN
1.0000 [drp] | OPHTHALMIC | Status: AC
  Administered 2018-10-05 (×3): 1 [drp] via OPHTHALMIC

## 2018-10-05 MED ORDER — MIDAZOLAM HCL 2 MG/2ML IJ SOLN
INTRAMUSCULAR | Status: DC | PRN
  Administered 2018-10-05: 2 mg via INTRAVENOUS

## 2018-10-05 MED ORDER — FENTANYL CITRATE (PF) 100 MCG/2ML IJ SOLN
INTRAMUSCULAR | Status: DC | PRN
  Administered 2018-10-05 (×4): 25 ug via INTRAVENOUS

## 2018-10-05 MED ORDER — NA HYALUR & NA CHOND-NA HYALUR 0.55-0.5 ML IO KIT
PACK | INTRAOCULAR | Status: AC
  Filled 2018-10-05: qty 1.05

## 2018-10-05 MED ORDER — ARMC OPHTHALMIC DILATING DROPS
OPHTHALMIC | Status: AC
  Administered 2018-10-05: 1 via OPHTHALMIC
  Filled 2018-10-05: qty 0.5

## 2018-10-05 MED ORDER — MOXIFLOXACIN HCL 0.5 % OP SOLN
OPHTHALMIC | Status: AC
  Filled 2018-10-05: qty 3

## 2018-10-05 MED ORDER — FENTANYL CITRATE (PF) 100 MCG/2ML IJ SOLN
INTRAMUSCULAR | Status: AC
  Filled 2018-10-05: qty 2

## 2018-10-05 MED ORDER — LIDOCAINE HCL (PF) 4 % IJ SOLN
INTRAOCULAR | Status: DC | PRN
  Administered 2018-10-05: 4 mL via OPHTHALMIC

## 2018-10-05 MED ORDER — TETRACAINE HCL 0.5 % OP SOLN
OPHTHALMIC | Status: AC
  Administered 2018-10-05: 1 [drp] via OPHTHALMIC
  Filled 2018-10-05: qty 4

## 2018-10-05 MED ORDER — TRYPAN BLUE 0.06 % OP SOLN
OPHTHALMIC | Status: AC
  Filled 2018-10-05: qty 0.5

## 2018-10-05 MED ORDER — BSS IO SOLN
INTRAOCULAR | Status: DC | PRN
  Administered 2018-10-05: 15 mL via INTRAOCULAR

## 2018-10-05 MED ORDER — MOXIFLOXACIN HCL 0.5 % OP SOLN: 1.0000 [drp] | OPHTHALMIC | Status: DC | PRN

## 2018-10-05 MED ORDER — EPINEPHRINE PF 1 MG/ML IJ SOLN
INTRAMUSCULAR | Status: AC
  Filled 2018-10-05: qty 2

## 2018-10-05 MED ORDER — LIDOCAINE HCL (PF) 4 % IJ SOLN
INTRAMUSCULAR | Status: AC
  Filled 2018-10-05: qty 5

## 2018-10-05 MED ORDER — POVIDONE-IODINE 5 % OP SOLN
OPHTHALMIC | Status: AC
  Filled 2018-10-05: qty 60

## 2018-10-05 MED ORDER — TRYPAN BLUE 0.06 % OP SOLN
OPHTHALMIC | Status: DC | PRN
  Administered 2018-10-05: 0.5 mL via INTRAOCULAR

## 2018-10-05 MED ORDER — MOXIFLOXACIN HCL 0.5 % OP SOLN
OPHTHALMIC | Status: DC | PRN
  Administered 2018-10-05: 0.2 mL via OPHTHALMIC

## 2018-10-05 MED ORDER — EPINEPHRINE PF 1 MG/ML IJ SOLN
INTRAOCULAR | Status: DC | PRN
  Administered 2018-10-05: 08:00:00 via OPHTHALMIC

## 2018-10-05 MED ORDER — POVIDONE-IODINE 5 % OP SOLN
OPHTHALMIC | Status: DC | PRN
  Administered 2018-10-05 (×2): 1 via OPHTHALMIC

## 2018-10-05 SURGICAL SUPPLY — 18 items
DISSECTOR HYDRO NUCLEUS 50X22 (MISCELLANEOUS) ×8 IMPLANT
DRSG TEGADERM 4X4.75 (GAUZE/BANDAGES/DRESSINGS) ×2 IMPLANT
GLOVE BIOGEL M 6.5 STRL (GLOVE) ×2 IMPLANT
GOWN STRL REUS W/ TWL LRG LVL3 (GOWN DISPOSABLE) ×1 IMPLANT
GOWN STRL REUS W/ TWL XL LVL3 (GOWN DISPOSABLE) ×1 IMPLANT
GOWN STRL REUS W/TWL LRG LVL3 (GOWN DISPOSABLE) ×1
GOWN STRL REUS W/TWL XL LVL3 (GOWN DISPOSABLE) ×1
KNIFE 45D UP 2.3 (MISCELLANEOUS) ×2 IMPLANT
LABEL CATARACT MEDS ST (LABEL) ×2 IMPLANT
LENS IOL ACRYSOF IQ 20.5 (Intraocular Lens) ×2 IMPLANT
PACK CATARACT (MISCELLANEOUS) ×2 IMPLANT
PACK CATARACT KING (MISCELLANEOUS) ×2 IMPLANT
PACK EYE AFTER SURG (MISCELLANEOUS) ×2 IMPLANT
SOL BSS BAG (MISCELLANEOUS) ×2
SOLUTION BSS BAG (MISCELLANEOUS) ×1 IMPLANT
SYR 5ML LL (SYRINGE) ×2 IMPLANT
WATER STERILE IRR 250ML POUR (IV SOLUTION) ×2 IMPLANT
WIPE NON LINTING 3.25X3.25 (MISCELLANEOUS) ×2 IMPLANT

## 2018-10-05 NOTE — H&P (Signed)
  I have reviewed the H&P and agree with its findings.  Karita Dralle MD  

## 2018-10-05 NOTE — Anesthesia Preprocedure Evaluation (Signed)
Anesthesia Evaluation  Patient identified by MRN, date of birth, ID band Patient awake    Reviewed: Allergy & Precautions, NPO status , Patient's Chart, lab work & pertinent test results  History of Anesthesia Complications Negative for: history of anesthetic complications  Airway Mallampati: III  TM Distance: >3 FB Neck ROM: Full    Dental no notable dental hx.    Pulmonary neg sleep apnea, neg COPD, former smoker,    breath sounds clear to auscultation- rhonchi (-) wheezing      Cardiovascular hypertension, Pt. on medications (-) CAD, (-) Past MI, (-) Cardiac Stents and (-) CABG  Rhythm:Regular Rate:Normal - Systolic murmurs and - Diastolic murmurs    Neuro/Psych negative neurological ROS  negative psych ROS   GI/Hepatic negative GI ROS, Neg liver ROS,   Endo/Other  diabetes, Oral Hypoglycemic AgentsHypothyroidism   Renal/GU negative Renal ROS     Musculoskeletal negative musculoskeletal ROS (+)   Abdominal (+) + obese,   Peds  Hematology negative hematology ROS (+)   Anesthesia Other Findings Past Medical History: No date: Diabetes mellitus without complication (HCC) No date: Heart murmur No date: Hyperlipidemia No date: Hypertension No date: Hypothyroidism No date: Thyroid disease No date: Vertigo No date: Wheezing     Comment:  OCCAS   Reproductive/Obstetrics                             Anesthesia Physical Anesthesia Plan  ASA: II  Anesthesia Plan: MAC   Post-op Pain Management:    Induction: Intravenous  PONV Risk Score and Plan: 2 and Midazolam  Airway Management Planned: Natural Airway  Additional Equipment:   Intra-op Plan:   Post-operative Plan:   Informed Consent: I have reviewed the patients History and Physical, chart, labs and discussed the procedure including the risks, benefits and alternatives for the proposed anesthesia with the patient or  authorized representative who has indicated his/her understanding and acceptance.     Plan Discussed with: CRNA and Anesthesiologist  Anesthesia Plan Comments:         Anesthesia Quick Evaluation

## 2018-10-05 NOTE — Op Note (Signed)
  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   OPERATIVE PROCEDURE:Cataract surgery left eye   SURGEON:  Elliot Cousin, MD.   ANESTHESIA:  Anesthesiologist: Alver Fisher, MD CRNA: Irving Burton, CRNA  1.      Managed anesthesia care. 2.     0.67ml of Shugarcaine was instilled following the paracentesis   COMPLICATIONS:  None.   TECHNIQUE:   Divide and conquer   DESCRIPTION OF PROCEDURE:  The patient was examined and consented in the preoperative holding area where the aforementioned topical anesthesia was applied to the left eye and then brought back to the Operating Room where the left eye was prepped and draped in the usual sterile ophthalmic fashion and a lid speculum was placed. A paracentesis was created with the side port blade, the anterior chamber was washed out with trypan blue to stain the anterior capsule, and the anterior chamber was filled with viscoelastic. A near clear corneal incision was performed with the steel keratome. A continuous curvilinear capsulorrhexis was performed with a cystotome followed by the capsulorrhexis forceps. Hydrodissection and hydrodelineation were carried out with BSS on a blunt cannula. The lens was removed in a divide and conquer  technique and the remaining cortical material was removed with the irrigation-aspiration handpiece. The capsular bag was inflated with viscoelastic and the lens was placed in the capsular bag without complication. The remaining viscoelastic was removed from the eye with the irrigation-aspiration handpiece. The wounds were hydrated. The anterior chamber was flushed and the eye was inflated to physiologic pressure. 0.63ml Vigamox was placed in the anterior chamber. The wounds were found to be water tight. The eye was dressed with Vigamox. The patient was given protective glasses to wear throughout the day and a  shield with which to sleep tonight. The patient was also given drops with which to begin a drop regimen today and will follow-up with me in one day. Implant Name Type Inv. Item Serial No. Manufacturer Lot No. LRB No. Used  LENS IOL ACRYSOF IQ 20.5 - [id number] Intraocular Lens LENS IOL ACRYSOF IQ 20.5 [id number] ALCON  Left 1    Procedure(s) with comments: CATARACT EXTRACTION PHACO AND INTRAOCULAR LENS PLACEMENT (IOC) (Left) - [place] 00:46.5 CDE 8.29 Fluid Pack Lot # [id number]  Electronically signed: Elliot Cousin 10/05/2018 8:26 AM

## 2018-10-05 NOTE — Discharge Instructions (Signed)
Eye Surgery Discharge Instructions    Expect mild scratchy sensation or mild soreness. DO NOT RUB YOUR EYE!  The day of surgery:  Minimal physical activity, but bed rest is not required  No reading, computer work, or close hand work  No bending, lifting, or straining.  May watch TV  For 24 hours:  No driving, legal decisions, or alcoholic beverages  Safety precautions  Eat anything you prefer: It is better to start with liquids, then soup then solid foods.  _____ Eye patch should be worn until postoperative exam tomorrow.  ____ Solar shield eyeglasses should be worn for comfort in the sunlight/patch while sleeping  Resume all regular medications including aspirin or Coumadin if these were discontinued prior to surgery. You may shower, bathe, shave, or wash your hair. Tylenol may be taken for mild discomfort.  Call your doctor if you experience significant pain, nausea, or vomiting, fever > 101 or other signs of infection. [phone] or [phone] Specific instructions:  Follow-up Information    Elliot Cousin, MD Follow up.   Specialty:  Ophthalmology Why:  10/06/18 at 10:45 Contact information: [address] [place] [address] [phone]        Carollee Herter information: [address] [place] [address] [phone]          Eye Surgery Discharge Instructions    Expect mild scratchy sensation or mild soreness. DO NOT RUB YOUR EYE!  The day of surgery:  Minimal physical activity, but bed rest is not required  No reading, computer work, or close hand work  No bending, lifting, or straining.  May watch TV  For 24 hours:  No driving, legal decisions, or alcoholic beverages  Safety precautions  Eat anything you prefer: It is better to start with liquids, then soup then solid foods.  _____ Eye patch should be worn until postoperative exam tomorrow.  ____ Solar shield eyeglasses should be worn for  comfort in the sunlight/patch while sleeping  Resume all regular medications including aspirin or Coumadin if these were discontinued prior to surgery. You may shower, bathe, shave, or wash your hair. Tylenol may be taken for mild discomfort.  Call your doctor if you experience significant pain, nausea, or vomiting, fever > 101 or other signs of infection. [phone] or [phone] Specific instructions:  Follow-up Information    Elliot Cousin, MD Follow up.   Specialty:  Ophthalmology Why:  10/06/18 at 10:45 Contact information: [address] [place] [address] [phone]        Carollee Herter information: [address] [place] [address] [phone]

## 2018-10-05 NOTE — Anesthesia Post-op Follow-up Note (Signed)
Anesthesia QCDR form completed.        

## 2018-10-05 NOTE — Transfer of Care (Signed)
Immediate Anesthesia Transfer of Care Note  Patient: Erica Camacho  Procedure(s) Performed: CATARACT EXTRACTION PHACO AND INTRAOCULAR LENS PLACEMENT (IOC) (Left Eye)  Patient Location: Short Stay  Anesthesia Type:MAC  Level of Consciousness: awake, alert  and oriented  Airway & Oxygen Therapy: Patient Spontanous Breathing  Post-op Assessment: Post -op Vital signs reviewed and stable  Post vital signs: stable  Last Vitals:  Vitals Value Taken Time  BP    Temp    Pulse    Resp    SpO2      Last Pain:  Vitals:   10/05/18 0611  TempSrc: Tympanic  PainSc: 0-No pain         Complications: No apparent anesthesia complications

## 2018-10-05 NOTE — Anesthesia Postprocedure Evaluation (Signed)
Anesthesia Post Note  Patient: Erica Camacho  Procedure(s) Performed: CATARACT EXTRACTION PHACO AND INTRAOCULAR LENS PLACEMENT (IOC) (Left Eye)  Patient location during evaluation: Short Stay Anesthesia Type: MAC Level of consciousness: awake and alert Pain management: pain level controlled Vital Signs Assessment: post-procedure vital signs reviewed and stable Respiratory status: spontaneous breathing, nonlabored ventilation, respiratory function stable and patient connected to nasal cannula oxygen Cardiovascular status: stable and blood pressure returned to baseline Postop Assessment: no apparent nausea or vomiting Anesthetic complications: no     Last Vitals:  Vitals:   10/05/18 0611 10/05/18 0824  BP: (!) 149/62 (!) 142/44  Pulse: 87 68  Resp: 18 16  Temp: 36.4 C 36.4 C  SpO2: 99% 99%    Last Pain:  Vitals:   10/05/18 0824  TempSrc: Temporal  PainSc: 0-No pain                 Jaymarion Trombly,  Clearnce Sorrel

## 2019-03-08 ENCOUNTER — Ambulatory Visit: Admission: RE | Admit: 2019-03-08 | Payer: Medicare Other | Source: Home / Self Care

## 2019-03-08 ENCOUNTER — Encounter: Admission: RE | Payer: Self-pay | Source: Home / Self Care

## 2019-03-08 SURGERY — PHACOEMULSIFICATION, CATARACT, WITH IOL INSERTION
Anesthesia: Choice | Laterality: Right

## 2019-05-09 ENCOUNTER — Other Ambulatory Visit: Payer: Self-pay | Admitting: Orthopedic Surgery

## 2019-05-09 DIAGNOSIS — R2232 Localized swelling, mass and lump, left upper limb: Secondary | ICD-10-CM

## 2019-05-17 ENCOUNTER — Other Ambulatory Visit: Payer: Self-pay

## 2019-05-17 ENCOUNTER — Ambulatory Visit
Admission: RE | Admit: 2019-05-17 | Discharge: 2019-05-17 | Disposition: A | Discharge status: Home / Self Care | Payer: Medicare Other | Source: Ambulatory Visit | Attending: Orthopedic Surgery | Admitting: Orthopedic Surgery

## 2019-05-17 DIAGNOSIS — R2232 Localized swelling, mass and lump, left upper limb: Secondary | ICD-10-CM | POA: Diagnosis present

## 2019-05-17 LAB — POCT I-STAT CREATININE: Creatinine, Ser: 1 mg/dL (ref 0.44–1.00)

## 2019-05-17 MED ORDER — GADOBUTROL 1 MMOL/ML IV SOLN
8.0000 mL | Freq: Once | INTRAVENOUS | Status: AC | PRN
  Administered 2019-05-17: 8 mL via INTRAVENOUS

## 2019-05-25 ENCOUNTER — Ambulatory Visit: Payer: Medicare Other

## 2019-09-29 ENCOUNTER — Ambulatory Visit: Admit: 2019-09-29 | Payer: Medicare Other

## 2019-09-29 SURGERY — PHACOEMULSIFICATION, CATARACT, WITH IOL INSERTION
Anesthesia: Choice | Laterality: Right

## 2020-07-28 ENCOUNTER — Encounter: Payer: Self-pay | Admitting: Emergency Medicine

## 2020-07-28 ENCOUNTER — Emergency Department
Admission: EM | Admit: 2020-07-28 | Discharge: 2020-07-28 | Disposition: A | Discharge status: Home / Self Care | Payer: Medicare PPO | Attending: Emergency Medicine | Admitting: Emergency Medicine

## 2020-07-28 ENCOUNTER — Other Ambulatory Visit: Payer: Self-pay

## 2020-07-28 DIAGNOSIS — R197 Diarrhea, unspecified: Secondary | ICD-10-CM | POA: Insufficient documentation

## 2020-07-28 DIAGNOSIS — Z5321 Procedure and treatment not carried out due to patient leaving prior to being seen by health care provider: Secondary | ICD-10-CM | POA: Diagnosis not present

## 2020-07-28 DIAGNOSIS — R1110 Vomiting, unspecified: Secondary | ICD-10-CM | POA: Diagnosis present

## 2020-07-28 DIAGNOSIS — R05 Cough: Secondary | ICD-10-CM | POA: Insufficient documentation

## 2020-07-28 DIAGNOSIS — R630 Anorexia: Secondary | ICD-10-CM | POA: Diagnosis not present

## 2020-07-28 LAB — URINALYSIS, COMPLETE (UACMP) WITH MICROSCOPIC
Bilirubin Urine: NEGATIVE
Glucose, UA: NEGATIVE mg/dL
Hgb urine dipstick: NEGATIVE
Ketones, ur: 5 mg/dL — AB
Leukocytes,Ua: NEGATIVE
Nitrite: NEGATIVE
Protein, ur: 100 mg/dL — AB
Specific Gravity, Urine: 1.017 (ref 1.005–1.030)
pH: 5 (ref 5.0–8.0)

## 2020-07-28 NOTE — ED Notes (Signed)
No answer when called several times from lobby 

## 2020-07-28 NOTE — ED Notes (Signed)
Lab called to collect blood 

## 2020-07-28 NOTE — ED Triage Notes (Addendum)
Pt via pov from urgent care with emesis, diarrhea, coughing x 6 days. Pt reports anorexia as well. Denies sob, but pt is wheezing. Pt alert & oriented, nad noted.

## 2020-07-28 NOTE — Progress Notes (Signed)
[facility] consulted to draw labs for patient as lab was unsuccessful. SecureChat sent to pt's nurse in ED explaining that this patient currently does not have any orders for IV meds/fluids or studies that require an IV (such as CT). Asked that Corrie Dandy, RN ask coworkers for assistance in obtaining lab work. Advised if circumstances change and pt actually needs an IV, to place another IV team consult.

## 2020-08-05 ENCOUNTER — Other Ambulatory Visit: Payer: Self-pay

## 2020-08-05 ENCOUNTER — Emergency Department: Payer: Medicare PPO

## 2020-08-05 ENCOUNTER — Encounter: Payer: Self-pay | Admitting: Emergency Medicine

## 2020-08-05 ENCOUNTER — Inpatient Hospital Stay
Admission: EM | Admit: 2020-08-05 | Discharge: 2020-08-10 | DRG: 177 | Disposition: A | Discharge status: Home Health | Payer: Medicare PPO | Attending: Internal Medicine | Admitting: Internal Medicine

## 2020-08-05 DIAGNOSIS — Z7984 Long term (current) use of oral hypoglycemic drugs: Secondary | ICD-10-CM

## 2020-08-05 DIAGNOSIS — Z87891 Personal history of nicotine dependence: Secondary | ICD-10-CM

## 2020-08-05 DIAGNOSIS — I10 Essential (primary) hypertension: Secondary | ICD-10-CM

## 2020-08-05 DIAGNOSIS — U071 COVID-19: Secondary | ICD-10-CM | POA: Diagnosis not present

## 2020-08-05 DIAGNOSIS — Z79899 Other long term (current) drug therapy: Secondary | ICD-10-CM

## 2020-08-05 DIAGNOSIS — E785 Hyperlipidemia, unspecified: Secondary | ICD-10-CM | POA: Diagnosis present

## 2020-08-05 DIAGNOSIS — J1282 Pneumonia due to coronavirus disease 2019: Secondary | ICD-10-CM | POA: Diagnosis present

## 2020-08-05 DIAGNOSIS — J9601 Acute respiratory failure with hypoxia: Secondary | ICD-10-CM | POA: Diagnosis present

## 2020-08-05 DIAGNOSIS — R0902 Hypoxemia: Secondary | ICD-10-CM

## 2020-08-05 DIAGNOSIS — Z6835 Body mass index (BMI) 35.0-35.9, adult: Secondary | ICD-10-CM

## 2020-08-05 DIAGNOSIS — E1169 Type 2 diabetes mellitus with other specified complication: Secondary | ICD-10-CM

## 2020-08-05 DIAGNOSIS — T380X5A Adverse effect of glucocorticoids and synthetic analogues, initial encounter: Secondary | ICD-10-CM | POA: Diagnosis present

## 2020-08-05 DIAGNOSIS — J440 Chronic obstructive pulmonary disease with acute lower respiratory infection: Secondary | ICD-10-CM | POA: Diagnosis present

## 2020-08-05 DIAGNOSIS — E039 Hypothyroidism, unspecified: Secondary | ICD-10-CM | POA: Diagnosis present

## 2020-08-05 DIAGNOSIS — E669 Obesity, unspecified: Secondary | ICD-10-CM | POA: Diagnosis present

## 2020-08-05 DIAGNOSIS — Z7982 Long term (current) use of aspirin: Secondary | ICD-10-CM

## 2020-08-05 DIAGNOSIS — J449 Chronic obstructive pulmonary disease, unspecified: Secondary | ICD-10-CM

## 2020-08-05 DIAGNOSIS — E1165 Type 2 diabetes mellitus with hyperglycemia: Secondary | ICD-10-CM

## 2020-08-05 HISTORY — DX: Dyspnea, unspecified: R06.00

## 2020-08-05 HISTORY — DX: Chronic obstructive pulmonary disease, unspecified: J44.9

## 2020-08-05 LAB — COMPREHENSIVE METABOLIC PANEL
ALT: 12 U/L (ref 0–44)
AST: 20 U/L (ref 15–41)
Albumin: 3.7 g/dL (ref 3.5–5.0)
Alkaline Phosphatase: 52 U/L (ref 38–126)
Anion gap: 15 (ref 5–15)
BUN: 24 mg/dL — ABNORMAL HIGH (ref 8–23)
CO2: 23 mmol/L (ref 22–32)
Calcium: 8.9 mg/dL (ref 8.9–10.3)
Chloride: 96 mmol/L — ABNORMAL LOW (ref 98–111)
Creatinine, Ser: 0.84 mg/dL (ref 0.44–1.00)
GFR calc Af Amer: >60 mL/min (ref >60)
GFR calc non Af Amer: >60 mL/min (ref >60)
Glucose, Bld: 253 mg/dL — ABNORMAL HIGH (ref 70–99)
Potassium: 4.4 mmol/L (ref 3.5–5.1)
Sodium: 134 mmol/L — ABNORMAL LOW (ref 135–145)
Total Bilirubin: 1 mg/dL (ref 0.3–1.2)
Total Protein: 7.7 g/dL (ref 6.5–8.1)

## 2020-08-05 LAB — CBC WITH DIFFERENTIAL/PLATELET
Abs Immature Granulocytes: 0.24 10*3/uL — ABNORMAL HIGH (ref 0.00–0.07)
Basophils Absolute: 0 10*3/uL (ref 0.0–0.1)
Basophils Relative: 0 %
Eosinophils Absolute: 0 10*3/uL (ref 0.0–0.5)
Eosinophils Relative: 0 %
HCT: 41.2 % (ref 36.0–46.0)
Hemoglobin: 13.1 g/dL (ref 12.0–15.0)
Immature Granulocytes: 2 %
Lymphocytes Relative: 12 %
Lymphs Abs: 1.9 10*3/uL (ref 0.7–4.0)
MCH: 25.8 pg — ABNORMAL LOW (ref 26.0–34.0)
MCHC: 31.8 g/dL (ref 30.0–36.0)
MCV: 81.1 fL (ref 80.0–100.0)
Monocytes Absolute: 1.2 10*3/uL — ABNORMAL HIGH (ref 0.1–1.0)
Monocytes Relative: 8 %
Neutro Abs: 11.9 10*3/uL — ABNORMAL HIGH (ref 1.7–7.7)
Neutrophils Relative %: 78 %
Platelets: 375 10*3/uL (ref 150–400)
RBC: 5.08 MIL/uL (ref 3.87–5.11)
RDW: 15.3 % (ref 11.5–15.5)
WBC: 15.3 10*3/uL — ABNORMAL HIGH (ref 4.0–10.5)
nRBC: 0 % (ref 0.0–0.2)

## 2020-08-05 LAB — APTT: aPTT: 31 seconds (ref 24–36)

## 2020-08-05 LAB — PROTIME-INR
INR: 1.1 (ref 0.8–1.2)
Prothrombin Time: 13.7 seconds (ref 11.4–15.2)

## 2020-08-05 LAB — LACTIC ACID, PLASMA: Lactic Acid, Venous: 1.7 mmol/L (ref 0.5–1.9)

## 2020-08-05 MED ORDER — SODIUM CHLORIDE 0.9% FLUSH: 10.0000 mL | INTRAVENOUS | Status: DC | PRN

## 2020-08-05 MED ORDER — SODIUM CHLORIDE 0.9% FLUSH
10.0000 mL | Freq: Two times a day (BID) | INTRAVENOUS | Status: DC
  Administered 2020-08-06 – 2020-08-10 (×9): 10 mL

## 2020-08-05 NOTE — ED Notes (Signed)
Family continuously up to desk asking about why her mother is not been back and why other people have gone ahead of her. Family member asking about

## 2020-08-05 NOTE — ED Triage Notes (Signed)
Pt via ems from home with sob x 4 days. Pt states sob started august 24. Pt positive for covid 07/28/2020. Pt states her sob is worse today than it has been. Pt alert & oriented, nad noted.

## 2020-08-05 NOTE — ED Provider Notes (Signed)
[facility] Emergency Department Provider Note   ____________________________________________   First MD Initiated Contact with Patient 08/05/20 2126     (approximate)  I have reviewed the triage vital signs and the nursing notes.   HISTORY  Chief Complaint Shortness of Breath    HPI Erica Camacho is a 69 y.o. female reports shortness of breath starting on August 24.  She was positive for Covid on August 30.  She says today is worse than it ever been.  She feels achy all over.  She was 80% on room air when she got here.  Is now 95-97 on 4 L.  She has been running a temperature here in the ER at 1199.  She has a nonproductive cough.         Past Medical History:  Diagnosis Date  . Diabetes mellitus without complication (HCC)   . Heart murmur   . Hyperlipidemia   . Hypertension   . Hypothyroidism   . Thyroid disease   . Vertigo   . Wheezing    OCCAS    There are no problems to display for this patient.   Past Surgical History:  Procedure Laterality Date  . CATARACT EXTRACTION W/PHACO Left 10/05/2018   Procedure: CATARACT EXTRACTION PHACO AND INTRAOCULAR LENS PLACEMENT (IOC);  Surgeon: Elliot Cousin, MD;  Location: [facility] ORS;  Service: Ophthalmology;  Laterality: Left;  [place] 00:46.5 CDE 8.29 Fluid Pack Lot # [id number]  . CESAREAN SECTION     X 3  . FRACTURE SURGERY      Prior to Admission medications   Medication Sig Start Date End Date Taking? Authorizing Provider  acetaminophen (TYLENOL) 500 MG tablet Take 500 mg by mouth daily as needed for moderate pain or headache.    [provider]  albuterol (PROVENTIL HFA;VENTOLIN HFA) 108 (90 Base) MCG/ACT inhaler Inhale 2 puffs into the lungs every 6 (six) hours as needed for wheezing or shortness of breath. 09/25/18   Phineas Semen, MD  albuterol (PROVENTIL) (2.5 MG/3ML) 0.083% nebulizer solution Take 3 mLs (2.5 mg total) by nebulization every 6 (six) hours as needed for wheezing or  shortness of breath. 05/18/16   Sharman Cheek, MD  albuterol (PROVENTIL) (2.5 MG/3ML) 0.083% nebulizer solution Take 3 mLs (2.5 mg total) by nebulization every 6 (six) hours as needed for wheezing or shortness of breath. Patient not taking: Reported on 10/05/2018 09/25/18   Phineas Semen, MD  aspirin EC 81 MG tablet Take 81 mg by mouth daily.    [provider]  atorvastatin (LIPITOR) 40 MG tablet Take 40 mg by mouth daily.    [provider]  Cholecalciferol (VITAMIN D3 PO) Take 1 capsule by mouth daily.    [provider]  glimepiride (AMARYL) 4 MG tablet Take 4 mg by mouth daily.    [provider]  levothyroxine (SYNTHROID, LEVOTHROID) 75 MCG tablet Take 75 mcg by mouth daily before breakfast.    [provider]  losartan (COZAAR) 100 MG tablet Take 100 mg by mouth daily.    [provider]  Multiple Vitamins-Minerals (EMERGEN-C IMMUNE) PACK Take 1 packet by mouth daily as needed (immune support).    [provider]  predniSONE (STERAPRED UNI-PAK 21 TAB) 10 MG (21) TBPK tablet Per packaging instructions Patient not taking: Reported on 10/05/2018 09/25/18   Phineas Semen, MD  sitaGLIPtin (JANUVIA) 100 MG tablet Take 100 mg by mouth daily.    [provider]  Tetrahydrozoline HCl (VISINE OP) Place 1  drop into both eyes daily as needed (dry eyes).    [provider]  triamterene-hydrochlorothiazide (MAXZIDE-25) 37.5-25 MG tablet Take 1 tablet by mouth daily. 07/13/18   [provider]    Allergies Aspirin, Ampicillin, Carrot [daucus carota], Codeine, Latex, and Penicillins  History reviewed. No pertinent family history.  Social History Social History   Tobacco Use  . Smoking status: Former Games developer  . Smokeless tobacco: Never Used  Vaping Use  . Vaping Use: Never used  Substance Use Topics  . Alcohol use: No  . Drug use: No    Review of Systems  Constitutional:  fever/chills Eyes: No  visual changes. ENT: No sore throat. Cardiovascular: Denies chest pain. Respiratory: shortness of breath. Gastrointestinal: No abdominal pain.  No nausea, no vomiting.  No diarrhea.  No constipation. Genitourinary: Negative for dysuria. Musculoskeletal: Negative for back pain. Skin: Negative for rash. Neurological: Negative for headaches, focal weakness ____________________________________________   PHYSICAL EXAM:  VITAL SIGNS: ED Triage Vitals  Enc Vitals Group     BP 08/05/20 1308 (!) 148/52     Pulse Rate 08/05/20 1308 (!) 117     Resp 08/05/20 1308 19     Temp 08/05/20 1308 98.6 F (37 C)     Temp Source 08/05/20 1308 Oral     SpO2 08/05/20 1308 95 %     Weight --      Height --      Head Circumference --      Peak Flow --      Pain Score 08/05/20 1322 0     Pain Loc --      Pain Edu? --      Excl. in GC? --     Constitutional: Alert and oriented.  Ill-appearing gets short of breath when she moves around Eyes: Conjunctivae are normal. . Head: Atraumatic. Nose: No congestion/rhinnorhea. Mouth/Throat: Mucous membranes are moist.  Oropharynx non-erythematous. Neck: No stridor.  Cardiovascular: Normal rate, regular rhythm. Grossly normal heart sounds.  Good peripheral circulation. Respiratory: Normal respiratory effort.  No retractions. Lungs CTAB! Gastrointestinal: Soft and nontender. No distention. No abdominal bruits. No CVA tenderness. Musculoskeletal: No lower extremity tenderness nor edema.   Neurologic:  Normal speech and language. No gross focal neurologic deficits are appreciated. No gait instability. Skin:  Skin is warm, dry and intact.   ____________________________________________   LABS (all labs ordered are listed, but only abnormal results are displayed)  Labs Reviewed  COMPREHENSIVE METABOLIC PANEL - Abnormal; Notable for the following components:      Result Value   Sodium 134 (*)    Chloride 96 (*)    Glucose, Bld 253 (*)    BUN 24 (*)     All other components within normal limits  CBC WITH DIFFERENTIAL/PLATELET - Abnormal; Notable for the following components:   WBC 15.3 (*)    MCH 25.8 (*)    Neutro Abs 11.9 (*)    Monocytes Absolute 1.2 (*)    Abs Immature Granulocytes 0.24 (*)    All other components within normal limits  CULTURE, BLOOD (SINGLE)  URINE CULTURE  SARS CORONAVIRUS 2 BY RT PCR (HOSPITAL ORDER, PERFORMED IN [facility])  LACTIC ACID, PLASMA  URINALYSIS, COMPLETE (UACMP) WITH MICROSCOPIC  PROCALCITONIN  PROTIME-INR  APTT   ____________________________________________  EKG  EKG read interpreted by me shows sinus tach at rate of 117 normal axis there is no ST segment elevation I can see.  Computer is reading right atrial enlargement and this  does look true  EKG #2 read interpreted by me shows sinus tachycardia rate of 102 normal axis no acute ST-T wave changes ___________________________________________  RADIOLOGY  ED MD interpretation: Chest x-ray read by radiology reviewed by me shows bilateral pulmonary infiltrates consistent with multifocal pneumonia  Official radiology report(s): DG Chest 2 View  Result Date: 08/05/2020 CLINICAL DATA:  Shortness of breath. EXAM: CHEST - 2 VIEW COMPARISON:  09/25/2018. FINDINGS: Mediastinum hilar structures normal. Heart size normal. Diffuse prominent bilateral pulmonary interstitial infiltrates consistent with pneumonitis noted. Question patient's COVID status. No pleural effusion or pneumothorax. IMPRESSION: Diffuse prominent bilateral pulmonary interstitial infiltrates consistent pneumonitis noted. Question patient's COVID status. Electronically Signed   By: Maisie Fus  Register   On: 08/05/2020 14:24    ____________________________________________   PROCEDURES  Procedure(s) performed (including Critical Care):  Procedures   ____________________________________________   INITIAL IMPRESSION / ASSESSMENT AND PLAN / ED COURSE  Clinically the  patient does have worsening Covid pneumonia with hypoxia.  However she does have an elevated white count as well which is somewhat unusual with Covid.  We will send a procalcitonin and if this is also elevated likely start her on antibiotics as well.              ____________________________________________   FINAL CLINICAL IMPRESSION(S) / ED DIAGNOSES  Final diagnoses:  Hypoxia  COVID-19     ED Discharge Orders    None       Note:  This document was prepared using Dragon voice recognition software and may include unintentional dictation errors.    Arnaldo Natal, MD 08/05/20 2342

## 2020-08-05 NOTE — ED Triage Notes (Signed)
Pt in via EMS from home with c/o SOB. Pt is COVID + since Thursday. Pt 80% on RA and after placed on 4L is now 95%. ST 114, 96% 4L, 146/74

## 2020-08-05 NOTE — ED Notes (Addendum)
This RN attempted to get blood from the right AC x1. Mitch, RN attempted to get IV and blood on left [facility] x2. Lab called and stated they will try to send someone to get blood. IV team consult in. ER provider notified.    Pt states being diagnosed with covid-19 2 weeks ago and is having increased shortness of breath and chest pain.  Pt on cardiac, bp and pulse ox monitor.

## 2020-08-06 ENCOUNTER — Encounter: Payer: Self-pay | Admitting: Family Medicine

## 2020-08-06 DIAGNOSIS — J449 Chronic obstructive pulmonary disease, unspecified: Secondary | ICD-10-CM

## 2020-08-06 DIAGNOSIS — Z7984 Long term (current) use of oral hypoglycemic drugs: Secondary | ICD-10-CM | POA: Diagnosis not present

## 2020-08-06 DIAGNOSIS — R0902 Hypoxemia: Secondary | ICD-10-CM | POA: Diagnosis present

## 2020-08-06 DIAGNOSIS — E669 Obesity, unspecified: Secondary | ICD-10-CM | POA: Diagnosis present

## 2020-08-06 DIAGNOSIS — E785 Hyperlipidemia, unspecified: Secondary | ICD-10-CM | POA: Diagnosis present

## 2020-08-06 DIAGNOSIS — Z79899 Other long term (current) drug therapy: Secondary | ICD-10-CM | POA: Diagnosis not present

## 2020-08-06 DIAGNOSIS — U071 COVID-19: Secondary | ICD-10-CM | POA: Diagnosis present

## 2020-08-06 DIAGNOSIS — E039 Hypothyroidism, unspecified: Secondary | ICD-10-CM | POA: Diagnosis present

## 2020-08-06 DIAGNOSIS — E1165 Type 2 diabetes mellitus with hyperglycemia: Secondary | ICD-10-CM | POA: Diagnosis present

## 2020-08-06 DIAGNOSIS — Z6835 Body mass index (BMI) 35.0-35.9, adult: Secondary | ICD-10-CM | POA: Diagnosis not present

## 2020-08-06 DIAGNOSIS — J9601 Acute respiratory failure with hypoxia: Secondary | ICD-10-CM | POA: Diagnosis present

## 2020-08-06 DIAGNOSIS — Z7982 Long term (current) use of aspirin: Secondary | ICD-10-CM | POA: Diagnosis not present

## 2020-08-06 DIAGNOSIS — I10 Essential (primary) hypertension: Secondary | ICD-10-CM

## 2020-08-06 DIAGNOSIS — J1282 Pneumonia due to coronavirus disease 2019: Secondary | ICD-10-CM | POA: Diagnosis present

## 2020-08-06 DIAGNOSIS — E1169 Type 2 diabetes mellitus with other specified complication: Secondary | ICD-10-CM

## 2020-08-06 DIAGNOSIS — J440 Chronic obstructive pulmonary disease with acute lower respiratory infection: Secondary | ICD-10-CM | POA: Diagnosis present

## 2020-08-06 DIAGNOSIS — Z87891 Personal history of nicotine dependence: Secondary | ICD-10-CM | POA: Diagnosis not present

## 2020-08-06 DIAGNOSIS — T380X5A Adverse effect of glucocorticoids and synthetic analogues, initial encounter: Secondary | ICD-10-CM | POA: Diagnosis present

## 2020-08-06 LAB — C-REACTIVE PROTEIN: CRP: 12.2 mg/dL — ABNORMAL HIGH (ref <1.0)

## 2020-08-06 LAB — URINALYSIS, COMPLETE (UACMP) WITH MICROSCOPIC
Bilirubin Urine: NEGATIVE
Glucose, UA: >=500 mg/dL — AB
Hgb urine dipstick: NEGATIVE
Ketones, ur: 5 mg/dL — AB
Leukocytes,Ua: NEGATIVE
Nitrite: NEGATIVE
Protein, ur: NEGATIVE mg/dL
Specific Gravity, Urine: 1.016 (ref 1.005–1.030)
pH: 5 (ref 5.0–8.0)

## 2020-08-06 LAB — GLUCOSE, CAPILLARY
Glucose-Capillary: 243 mg/dL — ABNORMAL HIGH (ref 70–99)
Glucose-Capillary: 267 mg/dL — ABNORMAL HIGH (ref 70–99)
Glucose-Capillary: 268 mg/dL — ABNORMAL HIGH (ref 70–99)
Glucose-Capillary: 274 mg/dL — ABNORMAL HIGH (ref 70–99)
Glucose-Capillary: 276 mg/dL — ABNORMAL HIGH (ref 70–99)
Glucose-Capillary: 301 mg/dL — ABNORMAL HIGH (ref 70–99)

## 2020-08-06 LAB — LACTATE DEHYDROGENASE: LDH: 259 U/L — ABNORMAL HIGH (ref 98–192)

## 2020-08-06 LAB — HEPATITIS B SURFACE ANTIGEN: Hepatitis B Surface Ag: NONREACTIVE

## 2020-08-06 LAB — FIBRIN DERIVATIVES D-DIMER (ARMC ONLY): Fibrin derivatives D-dimer (ARMC): 687.97 ng/mL (FEU) — ABNORMAL HIGH (ref 0.00–499.00)

## 2020-08-06 LAB — HEMOGLOBIN A1C
Hgb A1c MFr Bld: 11 % — ABNORMAL HIGH (ref 4.8–5.6)
Mean Plasma Glucose: 269 mg/dL

## 2020-08-06 LAB — SARS CORONAVIRUS 2 BY RT PCR (HOSPITAL ORDER, PERFORMED IN ~~LOC~~ HOSPITAL LAB): SARS Coronavirus 2: POSITIVE — AB

## 2020-08-06 LAB — FERRITIN: Ferritin: 184 ng/mL (ref 11–307)

## 2020-08-06 LAB — PROCALCITONIN: Procalcitonin: <0.1 ng/mL

## 2020-08-06 LAB — FIBRINOGEN: Fibrinogen: 631 mg/dL — ABNORMAL HIGH (ref 210–475)

## 2020-08-06 LAB — HIV ANTIBODY (ROUTINE TESTING W REFLEX): HIV Screen 4th Generation wRfx: NONREACTIVE

## 2020-08-06 LAB — BRAIN NATRIURETIC PEPTIDE: B Natriuretic Peptide: 18.8 pg/mL (ref 0.0–100.0)

## 2020-08-06 LAB — ABO/RH: ABO/RH(D): AB POS

## 2020-08-06 MED ORDER — METHYLPREDNISOLONE SODIUM SUCC 125 MG IJ SOLR
1.0000 mg/kg | Freq: Two times a day (BID) | INTRAMUSCULAR | Status: AC
  Administered 2020-08-06 – 2020-08-08 (×6): 86.25 mg via INTRAVENOUS
  Filled 2020-08-06 (×6): qty 2

## 2020-08-06 MED ORDER — LINAGLIPTIN 5 MG PO TABS
5.0000 mg | ORAL_TABLET | Freq: Every day | ORAL | Status: DC
  Administered 2020-08-06 – 2020-08-10 (×4): 5 mg via ORAL
  Filled 2020-08-06 (×7): qty 1

## 2020-08-06 MED ORDER — BARICITINIB 2 MG PO TABS
4.0000 mg | ORAL_TABLET | Freq: Every day | ORAL | Status: DC
  Administered 2020-08-06 – 2020-08-10 (×5): 4 mg via ORAL
  Filled 2020-08-06 (×5): qty 2

## 2020-08-06 MED ORDER — LEVOTHYROXINE SODIUM 50 MCG PO TABS
75.0000 ug | ORAL_TABLET | Freq: Every day | ORAL | Status: DC
  Administered 2020-08-06: 75 ug via ORAL
  Filled 2020-08-06: qty 2

## 2020-08-06 MED ORDER — TRIAMTERENE-HCTZ 37.5-25 MG PO TABS
1.0000 | ORAL_TABLET | Freq: Every day | ORAL | Status: DC
  Administered 2020-08-06 – 2020-08-10 (×5): 1 via ORAL
  Filled 2020-08-06 (×5): qty 1

## 2020-08-06 MED ORDER — INSULIN ASPART 100 UNIT/ML ~~LOC~~ SOLN
0.0000 [IU] | Freq: Three times a day (TID) | SUBCUTANEOUS | Status: DC
  Administered 2020-08-06: 8 [IU] via SUBCUTANEOUS
  Administered 2020-08-06: 11 [IU] via SUBCUTANEOUS
  Administered 2020-08-06: 8 [IU] via SUBCUTANEOUS
  Administered 2020-08-07: 11 [IU] via SUBCUTANEOUS
  Administered 2020-08-07: 15 [IU] via SUBCUTANEOUS
  Administered 2020-08-07: 5 [IU] via SUBCUTANEOUS
  Administered 2020-08-08 (×2): 15 [IU] via SUBCUTANEOUS
  Filled 2020-08-06 (×7): qty 1

## 2020-08-06 MED ORDER — ACETAMINOPHEN 325 MG PO TABS: 650.0000 mg | ORAL_TABLET | Freq: Four times a day (QID) | ORAL | Status: DC | PRN

## 2020-08-06 MED ORDER — ENOXAPARIN SODIUM 40 MG/0.4ML ~~LOC~~ SOLN
40.0000 mg | SUBCUTANEOUS | Status: DC
  Administered 2020-08-06 – 2020-08-09 (×5): 40 mg via SUBCUTANEOUS
  Filled 2020-08-06 (×6): qty 0.4

## 2020-08-06 MED ORDER — ALBUTEROL SULFATE HFA 108 (90 BASE) MCG/ACT IN AERS
2.0000 | INHALATION_SPRAY | RESPIRATORY_TRACT | Status: DC | PRN
  Administered 2020-08-08 (×2): 2 via RESPIRATORY_TRACT
  Filled 2020-08-06 (×2): qty 6.7

## 2020-08-06 MED ORDER — PREDNISONE 50 MG PO TABS
50.0000 mg | ORAL_TABLET | Freq: Every day | ORAL | Status: DC
  Administered 2020-08-09 – 2020-08-10 (×2): 50 mg via ORAL
  Filled 2020-08-06 (×2): qty 1

## 2020-08-06 MED ORDER — INSULIN ASPART 100 UNIT/ML ~~LOC~~ SOLN
0.0000 [IU] | Freq: Every day | SUBCUTANEOUS | Status: DC
  Administered 2020-08-06 (×2): 3 [IU] via SUBCUTANEOUS
  Administered 2020-08-07: 5 [IU] via SUBCUTANEOUS
  Filled 2020-08-06 (×3): qty 1

## 2020-08-06 MED ORDER — ONDANSETRON HCL 4 MG PO TABS: 4.0000 mg | ORAL_TABLET | Freq: Four times a day (QID) | ORAL | Status: DC | PRN

## 2020-08-06 MED ORDER — ZINC SULFATE 220 (50 ZN) MG PO CAPS
220.0000 mg | ORAL_CAPSULE | Freq: Every day | ORAL | Status: DC
  Administered 2020-08-06 – 2020-08-10 (×5): 220 mg via ORAL
  Filled 2020-08-06 (×5): qty 1

## 2020-08-06 MED ORDER — ATORVASTATIN CALCIUM 20 MG PO TABS
40.0000 mg | ORAL_TABLET | Freq: Every day | ORAL | Status: DC
  Administered 2020-08-06 – 2020-08-10 (×5): 40 mg via ORAL
  Filled 2020-08-06 (×5): qty 2

## 2020-08-06 MED ORDER — SODIUM CHLORIDE 0.9 % IV SOLN
100.0000 mg | Freq: Every day | INTRAVENOUS | Status: AC
  Administered 2020-08-06 – 2020-08-09 (×4): 100 mg via INTRAVENOUS
  Filled 2020-08-06: qty 100
  Filled 2020-08-06 (×4): qty 20

## 2020-08-06 MED ORDER — LOSARTAN POTASSIUM 50 MG PO TABS
100.0000 mg | ORAL_TABLET | Freq: Every day | ORAL | Status: DC
  Administered 2020-08-06 – 2020-08-09 (×4): 100 mg via ORAL
  Filled 2020-08-06 (×5): qty 2

## 2020-08-06 MED ORDER — ONDANSETRON HCL 4 MG/2ML IJ SOLN: 4.0000 mg | Freq: Four times a day (QID) | INTRAMUSCULAR | Status: DC | PRN

## 2020-08-06 MED ORDER — SODIUM CHLORIDE 0.9 % IV SOLN
200.0000 mg | Freq: Once | INTRAVENOUS | Status: AC
  Administered 2020-08-06: 200 mg via INTRAVENOUS
  Filled 2020-08-06: qty 200

## 2020-08-06 MED ORDER — LACTATED RINGERS IV SOLN: INTRAVENOUS | Status: DC

## 2020-08-06 MED ORDER — ENSURE ENLIVE PO LIQD
237.0000 mL | Freq: Two times a day (BID) | ORAL | Status: DC
  Administered 2020-08-07: 237 mL via ORAL

## 2020-08-06 MED ORDER — ASCORBIC ACID 500 MG PO TABS
500.0000 mg | ORAL_TABLET | Freq: Every day | ORAL | Status: DC
  Administered 2020-08-06 – 2020-08-10 (×5): 500 mg via ORAL
  Filled 2020-08-06 (×5): qty 1

## 2020-08-06 MED ORDER — GUAIFENESIN-DM 100-10 MG/5ML PO SYRP
10.0000 mL | ORAL_SOLUTION | ORAL | Status: DC | PRN
  Administered 2020-08-06 – 2020-08-09 (×4): 10 mL via ORAL
  Filled 2020-08-06 (×5): qty 10

## 2020-08-06 NOTE — ED Notes (Signed)
Pt up to toilet 

## 2020-08-06 NOTE — ED Notes (Signed)
Pt got up and walked to the bathroom with 1 assist. Pt was not steady on feet and became dyspneic with the walk. Pt back in bed, on monitor 96% on 4LPM via [place]

## 2020-08-06 NOTE — ED Notes (Signed)
Pt tested positive for covid-19. ER provider notified.

## 2020-08-06 NOTE — ED Notes (Signed)
[name]: Daughter [phone]  Daughter called with permission from pt.

## 2020-08-06 NOTE — Progress Notes (Signed)
Remdesivir - Pharmacy Brief Note   O:  CXR: IMPRESSION: Diffuse prominent bilateral pulmonary interstitial infiltrates consistent pneumonitis noted. Question patient's COVID status. SpO2: 88 - 99% on 4L [place]   A/P:  Remdesivir 200 mg IVPB once followed by 100 mg IVPB daily x 4 days.   Thomasene Ripple, PharmD, BCPS Clinical Pharmacist 08/06/2020 12:32 AM

## 2020-08-06 NOTE — Progress Notes (Signed)
Inpatient Diabetes Program Recommendations  AACE/ADA: New Consensus Statement on Inpatient Glycemic Control   Target Ranges:  Prepandial:   less than 140 mg/dL      Peak postprandial:   less than 180 mg/dL (1-2 hours)      Critically ill patients:  140 - 180 mg/dL   Results for MAYSOON, LOZADA (MRN [id number]) as of 08/06/2020 11:49  Ref. Range 08/06/2020 02:09 08/06/2020 09:23  Glucose-Capillary Latest Ref Range: 70 - 99 mg/dL 456 (H) 256 (H)   Review of Glycemic Control  Diabetes history: DM2 Outpatient Diabetes medications: Amaryl 4 mg daily, Metformin 500 mg BID, Actos 30 mg daily, Januvia 100 mg daily Current orders for Inpatient glycemic control: Novolog 0-15 units TID with meals, Novolog 0-5 units QHS, Tradjenta 5 mg daily; Prednisone 50 mg daily, Solumedrol 86.25 mg Q12H  Inpatient Diabetes Program Recommendations:    Insulin: If steroids are continued as ordered please consider ordering Levemir 9 units Q24H (based on 86.2 kg x 0.1 units).  Thanks, Orlando Penner, RN, MSN, CDE Diabetes Coordinator Inpatient Diabetes Program [phone] (Team Pager from 8am to 5pm)

## 2020-08-06 NOTE — H&P (Signed)
History and Physical    Erica Camacho VQQ:[id number] DOB: 04-Feb-1951 DOA: 08/05/2020  PCP: Linard Millers, MD   Patient coming from: Home  Chief Complaint: Shortness of breath, cough, body aches  HPI: Erica Camacho is a 69 y.o. female with medical history significant for diabetes mellitus type 2, hypertension, COPD, hypothyroidism who presents for evaluation of shortness of breath with cough.  She reports that she has generalized body aches and feels "rundown and out of energy".  She reports her cough has been dry.  She reports she has had subjective fever and chills but has not taken her temperature at home.  She states shortness of breath is worsened with exertion but the last 2 days has had shortness of breath even at rest.  She reports that she was diagnosed on July 28, 2020 with Covid infection.  At that time she had some mild shortness of breath with exertion and cough.  She was treating symptomatically and quarantining at home but over the last 2 to 3 days symptoms have gotten worse with increasing shortness of breath, body aches and subjective fever.  She reports that 2 weeks ago she went to a childrens birthday party for her family and some of the teenagers that came to the party had Covid it turned out but they did not tell anyone she states.  She reports she has had mild diarrhea over the last week and has felt nauseous and has had decreased appetite for the last few days.  States she has not had any vomiting.  She reports she has lost her sense of taste and has decreased as of smell for the last week.   ED Course: Ms. Rester is found to be hypoxic in the low to mid 80s on room air when she arrived emergency room.  She was started on oxygen by nasal cannula and oxygen saturation improved to greater than 92%.  Chest x-ray was obtained and was consistent with Covid pneumonia.  The positive Covid test from August 30 cannot be confirmed so patient was reswabbed in the emergency room  tonight and was positive.   Review of Systems:  General: Reports generalized fatigue and weakness.  Reports subjective fever and chills.denies weight loss, night sweats.  Denies dizziness.  Reports decreased appetite HENT: Denies head trauma, headache, denies change in hearing, tinnitus.  Reports mild nasal congestion, no nasal bleeding. Denies sore throat, sores in mouth.  Denies difficulty swallowing.  Reports decreased taste and smell sensation Eyes: Denies blurry vision, pain in eye, drainage.  Denies discoloration of eyes. Neck: Denies pain.  Denies swelling.  Denies pain with movement. Cardiovascular: Denies chest pain, palpitations.  Denies edema.  Denies orthopnea Respiratory: Reports shortness of breath with nonproductive cough.  Reports wheezing.  Denies sputum production Gastrointestinal: Denies abdominal pain, swelling.  Reports mild nausea but no vomiting.  Reports intermittent diarrhea without blood or mucus.  Denies melena.  Denies hematemesis. Musculoskeletal: Denies limitation of movement.  Denies deformity or swelling.  Denies pain.  Denies arthralgias or myalgias. Genitourinary: Denies pelvic pain.  Denies urinary frequency or hesitancy.  Denies dysuria.  Skin: Denies rash.  Denies petechiae, purpura, ecchymosis. Neurological: Denies headache.  Denies syncope.  Denies seizure activity.  Denies weakness or paresthesia.  Denies slurred speech, drooping face.  Denies visual change. Psychiatric: Denies depression, anxiety.  Denies suicidal thoughts or ideation.  Denies hallucinations.  Past Medical History:  Diagnosis Date  . COPD (chronic obstructive pulmonary disease) (HCC)   . Diabetes  mellitus without complication (HCC)   . Dyspnea   . Heart murmur   . Hyperlipidemia   . Hypertension   . Hypothyroidism   . Thyroid disease   . Vertigo   . Wheezing    OCCAS    Past Surgical History:  Procedure Laterality Date  . CATARACT EXTRACTION W/PHACO Left 10/05/2018    Procedure: CATARACT EXTRACTION PHACO AND INTRAOCULAR LENS PLACEMENT (IOC);  Surgeon: Elliot Cousin, MD;  Location: [facility] ORS;  Service: Ophthalmology;  Laterality: Left;  [place] 00:46.5 CDE 8.29 Fluid Pack Lot # [id number]  . CESAREAN SECTION     X 3  . FRACTURE SURGERY      Social History  reports that she has quit smoking. She has never used smokeless tobacco. She reports that she does not drink alcohol and does not use drugs.  Allergies  Allergen Reactions  . Aspirin Hives and Other (See Comments)    Allergic to aspirin that is not coated. Hives and GI upset  . Ampicillin Hives  . Carrot [Daucus Carota] Hives  . Codeine Hives  . Latex Itching  . Penicillins Hives and Other (See Comments)    Has patient had a PCN reaction causing immediate rash, facial/tongue/throat swelling, SOB or lightheadedness with hypotension: No Has patient had a PCN reaction causing severe rash involving mucus membranes or skin necrosis: Yes Has patient had a PCN reaction that required hospitalization: Yes Has patient had a PCN reaction occurring within the last 10 years: No If all of the above answers are "NO", then may proceed with Cephalosporin use.     History reviewed. No pertinent family history.   Prior to Admission medications   Medication Sig Start Date End Date Taking? Authorizing Provider  acetaminophen (TYLENOL) 500 MG tablet Take 500 mg by mouth daily as needed for moderate pain or headache.    [provider]  albuterol (PROVENTIL HFA;VENTOLIN HFA) 108 (90 Base) MCG/ACT inhaler Inhale 2 puffs into the lungs every 6 (six) hours as needed for wheezing or shortness of breath. 09/25/18   Phineas Semen, MD  albuterol (PROVENTIL) (2.5 MG/3ML) 0.083% nebulizer solution Take 3 mLs (2.5 mg total) by nebulization every 6 (six) hours as needed for wheezing or shortness of breath. 05/18/16   Sharman Cheek, MD  albuterol (PROVENTIL) (2.5 MG/3ML) 0.083% nebulizer solution Take 3 mLs (2.5 mg  total) by nebulization every 6 (six) hours as needed for wheezing or shortness of breath. Patient not taking: Reported on 10/05/2018 09/25/18   Phineas Semen, MD  aspirin EC 81 MG tablet Take 81 mg by mouth daily.    [provider]  atorvastatin (LIPITOR) 40 MG tablet Take 40 mg by mouth daily.    [provider]  Cholecalciferol (VITAMIN D3 PO) Take 1 capsule by mouth daily.    [provider]  glimepiride (AMARYL) 4 MG tablet Take 4 mg by mouth daily.    [provider]  levothyroxine (SYNTHROID, LEVOTHROID) 75 MCG tablet Take 75 mcg by mouth daily before breakfast.    [provider]  losartan (COZAAR) 100 MG tablet Take 100 mg by mouth daily.    [provider]  Multiple Vitamins-Minerals (EMERGEN-C IMMUNE) PACK Take 1 packet by mouth daily as needed (immune support).    [provider]  predniSONE (STERAPRED UNI-PAK 21 TAB) 10 MG (21) TBPK tablet Per packaging instructions Patient not taking: Reported on 10/05/2018 09/25/18   Phineas Semen, MD  sitaGLIPtin (JANUVIA) 100 MG tablet Take 100 mg by mouth daily.  [provider]  Tetrahydrozoline HCl (VISINE OP) Place 1 drop into both eyes daily as needed (dry eyes).    [provider]  triamterene-hydrochlorothiazide (MAXZIDE-25) 37.5-25 MG tablet Take 1 tablet by mouth daily. 07/13/18   [provider]    Physical Exam: Vitals:   08/05/20 2200 08/05/20 2317 08/05/20 2321 08/05/20 2330  BP: (!) 159/64   (!) 153/64  Pulse: 96 92 94 85  Resp: 18 (!) 23 (!) 21 (!) 27  Temp:      TempSrc:      SpO2: 97% 96% 97% 99%    Constitutional: NAD, calm, comfortable Vitals:   08/05/20 2200 08/05/20 2317 08/05/20 2321 08/05/20 2330  BP: (!) 159/64   (!) 153/64  Pulse: 96 92 94 85  Resp: 18 (!) 23 (!) 21 (!) 27  Temp:      TempSrc:      SpO2: 97% 96% 97% 99%   General: WDWN, tired and ill appearing female. Alert and oriented x3.  Eyes: EOMI, PERRL,  lids and conjunctivae normal.  Sclera nonicteric HENT:  [place]/AT, external ears normal.  Nares patent without epistasis.  Mucous membranes are dry. Posterior pharynx clear of any exudate or lesions. Normal dentition.  Neck: Soft, normal range of motion, supple, no masses, no thyromegaly.  Trachea midline Respiratory: Equal but diminished breath sounds with bibasilar rales and diffuse expiratory wheezing. no crackles.  No rhonchi.  Normal respiratory effort. No accessory muscle use.  Cardiovascular: Regular rate and rhythm, 2/6 murmur.  No rubs / gallops. No extremity edema. 1+ pedal pulses. No carotid bruits.  Abdomen: Soft, no tenderness, nondistended, no rebound or guarding.  No masses palpated.  Obese.  Bowel sounds normoactive Musculoskeletal: FROM. no clubbing / cyanosis. No joint deformity upper and lower extremities. Normal muscle tone.  Skin: Warm, dry, intact no rashes, lesions, ulcers. No induration Neurologic: CN 2-12 grossly intact.  Normal speech.  Sensation intact. Strength 5/5 in all extremities.   Psychiatric: Normal judgment and insight.  Normal mood.    Labs on Admission: I have personally reviewed following labs and imaging studies  CBC: Recent Labs  Lab 08/05/20 1400  WBC 15.3*  NEUTROABS 11.9*  HGB 13.1  HCT 41.2  MCV 81.1  PLT 375    Basic Metabolic Panel: Recent Labs  Lab 08/05/20 1400  NA 134*  K 4.4  CL 96*  CO2 23  GLUCOSE 253*  BUN 24*  CREATININE 0.84  CALCIUM 8.9    GFR: Estimated Creatinine Clearance: 64.8 mL/min (by C-G formula based on SCr of 0.84 mg/dL).  Liver Function Tests: Recent Labs  Lab 08/05/20 1400  AST 20  ALT 12  ALKPHOS 52  BILITOT 1.0  PROT 7.7  ALBUMIN 3.7    Urine analysis:    Component Value Date/Time   COLORURINE YELLOW (A) 07/28/2020 1633   APPEARANCEUR HAZY (A) 07/28/2020 1633   LABSPEC 1.017 07/28/2020 1633   PHURINE 5.0 07/28/2020 1633   GLUCOSEU NEGATIVE 07/28/2020 1633   HGBUR NEGATIVE 07/28/2020 1633    BILIRUBINUR NEGATIVE 07/28/2020 1633   KETONESUR 5 (A) 07/28/2020 1633   PROTEINUR 100 (A) 07/28/2020 1633   NITRITE NEGATIVE 07/28/2020 1633   LEUKOCYTESUR NEGATIVE 07/28/2020 1633    Radiological Exams on Admission: DG Chest 2 View  Result Date: 08/05/2020 CLINICAL DATA:  Shortness of breath. EXAM: CHEST - 2 VIEW COMPARISON:  09/25/2018. FINDINGS: Mediastinum hilar structures normal. Heart size normal. Diffuse prominent bilateral pulmonary interstitial infiltrates consistent with pneumonitis noted. Question patient's  COVID status. No pleural effusion or pneumothorax. IMPRESSION: Diffuse prominent bilateral pulmonary interstitial infiltrates consistent pneumonitis noted. Question patient's COVID status. Electronically Signed   By: Maisie Fus  Register   On: 08/05/2020 14:24    EKG: Independently reviewed.  EKG is reviewed.  Sinus tachycardia.  No acute ST elevation or depression.  QTc is 424  Assessment/Plan Principal Problem:   Pneumonia due to COVID-19 virus Ms. Demedeiros is admitted to medical surgical floor under Covid precautions. She is now requiring oxygen to maintain O2 sats between 90 to 96%.  She is currently on 4 L oxygen by nasal cannula. Patient be started on remdesivir and Solu-Medrol per Covid protocol. Covid inflammatory labs are ordered and pending Chest x-ray is consistent with Covid pneumonia IV fluid hydration with LR at 100 mL's per hour overnight. Recheck CBC, electrolytes, renal function, inflammatory labs in morning  Active Problems:   Hypoxia Patient with oxygen saturation of 80 to 85% on room air when she arrived in the hospital.  Is now on nasal cannula oxygen to keep O2 sat between 92-96%.    Diabetes mellitus type 2 in obese (HCC) Blood sugars were monitored before every meal and nightly, sliding scale insulin as needed for glycemic control. Check hemoglobin A1c level. Januvia is changed to Tradjenta per formulary of the hospital and will be continued  daily.    Essential hypertension Patient continued on her home medication of losartan, Maxide Monitor blood pressure.    COPD (chronic obstructive pulmonary disease) (HCC) Patient ports she has mild COPD and uses albuterol nebulizer treatments at home when needed.  She states that she rarely ever needs it but for the last 2 to 3 days has needed it a few times a day with the Covid infection.  Will be continued on albuterol MDI for cough, shortness of breath, wheezing as needed. Incentive spirometer every 2 hours while awake.  Flutter valve as needed.  RT to follow    DVT prophylaxis: Padua score elevated.  Lovenox for DVT prophylaxis Code Status:   Full code Family Communication:  Diagnosis and plan discussed with patient.  Patient verbalized understanding.  Questions were answered.  Further recommendations to follow as clinically indicated  Disposition Plan:   Patient is from:  Home  Anticipated DC to:  Home  Anticipated DC date:  Anticipate greater than 2 midnight stay in the hospital to treat acute      medical condition  Anticipated DC barriers: No barriers to discharge identified this time  Admission status:  Inpatient  Severity of Illness: The appropriate patient status for this patient is INPATIENT. Inpatient status is judged to be reasonable and necessary in order to provide the required intensity of service to ensure the patient's safety. The patient's presenting symptoms, physical exam findings, and initial radiographic and laboratory data in the context of their chronic comorbidities is felt to place them at high risk for further clinical deterioration. Furthermore, it is not anticipated that the patient will be medically stable for discharge from the hospital within 2 midnights of admission. The following factors support the patient status of inpatient.     * I certify that at the point of admission it is my clinical judgment that the patient will require inpatient hospital  care spanning beyond 2 midnights from the point of admission due to high intensity of service, high risk for further deterioration and high frequency of surveillance required.Claudean Severance Dreshaun Stene MD [facility]  How to contact the [facility] Attending  or Consulting provider 7A - 7P or covering provider during after hours 7P -7A, for this patient?   1. Check the care team in [facility] and look for a) attending/consulting [facility] provider listed and b) the [facility] team listed 2. Log into [email or web address] and use [place]'s universal password to access. If you do not have the password, please contact the hospital operator. 3. Locate the [facility] provider you are looking for under [facility] and page to a number that you can be directly reached. 4. If you still have difficulty reaching the provider, please page the [facility] (Director on Call) for the Hospitalists listed on amion for assistance.  08/06/2020, 1:13 AM

## 2020-08-06 NOTE — ED Notes (Signed)
Admit MD at bedside

## 2020-08-06 NOTE — ED Notes (Signed)
Pt given breakfast tray

## 2020-08-06 NOTE — Progress Notes (Signed)
Hospitalist update note.  This is a nonbillable note.  Please see same-day H&P for full billable details  Briefly, this is a 69 year old female with history significant for diabetes, hypertension, COPD not on home oxygen, hypothyroidism who presents with shortness of breath and cough.  Found to be Covid positive.  Started on Covid specific treatments including remdesivir, baricitinib, steroids.  At time my evaluation this morning is dependent on 3 to 4 L nasal cannula.  Saturating well.  Speaking in complete sentences.  Mentating clearly.  Normal work of breathing.  We will continue current scope of care for now.  D/w patient's daughter Conan Bowens [phone]  See H&P for remainder of details regarding care    Lolita Patella MD

## 2020-08-06 NOTE — ED Notes (Signed)
Pt given lunch tray.

## 2020-08-06 NOTE — ED Notes (Signed)
Report called to [facility] rn floor nurse

## 2020-08-07 LAB — COMPREHENSIVE METABOLIC PANEL
ALT: 13 U/L (ref 0–44)
AST: 18 U/L (ref 15–41)
Albumin: 3.2 g/dL — ABNORMAL LOW (ref 3.5–5.0)
Alkaline Phosphatase: 47 U/L (ref 38–126)
Anion gap: 9 (ref 5–15)
BUN: 25 mg/dL — ABNORMAL HIGH (ref 8–23)
CO2: 28 mmol/L (ref 22–32)
Calcium: 9.1 mg/dL (ref 8.9–10.3)
Chloride: 101 mmol/L (ref 98–111)
Creatinine, Ser: 0.88 mg/dL (ref 0.44–1.00)
GFR calc Af Amer: >60 mL/min (ref >60)
GFR calc non Af Amer: >60 mL/min (ref >60)
Glucose, Bld: 257 mg/dL — ABNORMAL HIGH (ref 70–99)
Potassium: 4.9 mmol/L (ref 3.5–5.1)
Sodium: 138 mmol/L (ref 135–145)
Total Bilirubin: 0.8 mg/dL (ref 0.3–1.2)
Total Protein: 7.2 g/dL (ref 6.5–8.1)

## 2020-08-07 LAB — CBC WITH DIFFERENTIAL/PLATELET
Abs Immature Granulocytes: 0.82 10*3/uL — ABNORMAL HIGH (ref 0.00–0.07)
Basophils Absolute: 0 10*3/uL (ref 0.0–0.1)
Basophils Relative: 0 %
Eosinophils Absolute: 0 10*3/uL (ref 0.0–0.5)
Eosinophils Relative: 0 %
HCT: 36.9 % (ref 36.0–46.0)
Hemoglobin: 11.5 g/dL — ABNORMAL LOW (ref 12.0–15.0)
Immature Granulocytes: 4 %
Lymphocytes Relative: 7 %
Lymphs Abs: 1.7 10*3/uL (ref 0.7–4.0)
MCH: 26 pg (ref 26.0–34.0)
MCHC: 31.2 g/dL (ref 30.0–36.0)
MCV: 83.3 fL (ref 80.0–100.0)
Monocytes Absolute: 1.5 10*3/uL — ABNORMAL HIGH (ref 0.1–1.0)
Monocytes Relative: 7 %
Neutro Abs: 18.8 10*3/uL — ABNORMAL HIGH (ref 1.7–7.7)
Neutrophils Relative %: 82 %
Platelets: 381 10*3/uL (ref 150–400)
RBC: 4.43 MIL/uL (ref 3.87–5.11)
RDW: 15.1 % (ref 11.5–15.5)
WBC: 22.8 10*3/uL — ABNORMAL HIGH (ref 4.0–10.5)
nRBC: 0 % (ref 0.0–0.2)

## 2020-08-07 LAB — GLUCOSE, CAPILLARY
Glucose-Capillary: 249 mg/dL — ABNORMAL HIGH (ref 70–99)
Glucose-Capillary: 339 mg/dL — ABNORMAL HIGH (ref 70–99)
Glucose-Capillary: 367 mg/dL — ABNORMAL HIGH (ref 70–99)
Glucose-Capillary: 354 mg/dL — ABNORMAL HIGH (ref 70–99)

## 2020-08-07 LAB — URINE CULTURE: Culture: <10000 — AB

## 2020-08-07 LAB — C-REACTIVE PROTEIN: CRP: 10.4 mg/dL — ABNORMAL HIGH (ref <1.0)

## 2020-08-07 LAB — FERRITIN: Ferritin: 196 ng/mL (ref 11–307)

## 2020-08-07 LAB — FIBRIN DERIVATIVES D-DIMER (ARMC ONLY): Fibrin derivatives D-dimer (ARMC): 542.8 ng/mL (FEU) — ABNORMAL HIGH (ref 0.00–499.00)

## 2020-08-07 MED ORDER — LEVOTHYROXINE SODIUM 75 MCG PO TABS
75.0000 ug | ORAL_TABLET | Freq: Every day | ORAL | Status: DC
  Administered 2020-08-07: 75 ug via ORAL
  Filled 2020-08-07: qty 1

## 2020-08-07 MED ORDER — FUROSEMIDE 10 MG/ML IJ SOLN
40.0000 mg | Freq: Once | INTRAMUSCULAR | Status: AC
  Administered 2020-08-07: 13:00:00 40 mg via INTRAVENOUS
  Filled 2020-08-07: qty 4

## 2020-08-07 MED ORDER — INSULIN DETEMIR 100 UNIT/ML ~~LOC~~ SOLN
9.0000 [IU] | Freq: Every day | SUBCUTANEOUS | Status: DC
  Administered 2020-08-07: 9 [IU] via SUBCUTANEOUS
  Filled 2020-08-07 (×2): qty 0.09

## 2020-08-07 MED ORDER — ENSURE ENLIVE PO LIQD
237.0000 mL | Freq: Three times a day (TID) | ORAL | Status: DC
  Administered 2020-08-07 – 2020-08-08 (×2): 237 mL via ORAL

## 2020-08-07 MED ORDER — LEVOTHYROXINE SODIUM 50 MCG PO TABS
150.0000 ug | ORAL_TABLET | Freq: Every day | ORAL | Status: DC
  Administered 2020-08-08 – 2020-08-10 (×3): 150 ug via ORAL
  Filled 2020-08-07 (×3): qty 1

## 2020-08-07 NOTE — Evaluation (Signed)
Physical Therapy Evaluation Patient Details Name: Erica Camacho MRN: [id number] DOB: [birth year] Today's Date: 08/07/2020   History of Present Illness  Pt is a 69 y.o. female presenting to hospital 9/7 with SOB since August 24th; cough; (+) COVID August 30th per pt report and (+) 9/7 in ED; pt hypoxic.  Pt admitted with PNA d/t COVID-19 virus and hypoxia.  PMH includes COPD, DM, htn, thyroid disease, vertigo, h/o fx surgery.  Clinical Impression  Prior to hospital admission, pt was independent with ambulation; lives alone.  Currently pt is SBA semi-supine to sitting EOB; CGA with transfers; and CGA with taking steps in place (x5 reps B LE's) with UE support on RW.  Pt's HR 90-100 bpm at rest but increased to 130 bpm with transfer bed to recliner; after sitting rest break and HR decreasing back to resting rate pt stood and took above noted steps in place but pt's HR increased to 135 bpm and pt very SOB and needing extended seated rest break to improve SOB and decrease HR again.  Pt requiring UE support for standing activities so utilized RW which improved pt's balance and mobility; generalized weakness also noted.   Pt would benefit from skilled PT to address noted impairments and functional limitations (see below for any additional details).  Upon hospital discharge, d/t pt's impaired activity tolerance noted above, anticipate pt would benefit from STR unless pt has 24/7 assist to manage safely at home.    Follow Up Recommendations SNF;Supervision/Assistance - 24 hour    Equipment Recommendations  Rolling walker with 5" wheels;3in1 (PT);Wheelchair (measurements PT);Wheelchair cushion (measurements PT)    Recommendations for Other Services OT consult     Precautions / Restrictions Precautions Precautions: Fall Precaution Comments: monitor vitals; L brachial midline Restrictions Weight Bearing Restrictions: No      Mobility  Bed Mobility Overal bed mobility: Needs Assistance Bed Mobility:  Supine to Sit     Supine to sit: Supervision   General bed mobility comments: SBA for lines  Transfers Overall transfer level: Needs assistance Equipment used: None Transfers: Sit to/from [employer] Sit to Stand: Min guard Stand pivot transfers: Min guard (stand step turn bed to recliner with UE support on furniture)       General transfer comment: x1 trial standing from bed with single UE support; x1 trial standing from recliner with RW use (initial vc's for UE placement)  Ambulation/Gait Ambulation/Gait assistance: Min guard Gait Distance (Feet):  (marched in place x5 reps B LE's) Assistive device: Rolling walker (2 wheeled)   Gait velocity: decreased   General Gait Details: pt able to take steps in place (UE support on RW) but overall limited d/t increased SOB and HR increasing from 90-100 bpm at rest to 135 bpm with taking steps in place  Stairs            Wheelchair Mobility    Modified Rankin (Stroke Patients Only)       Balance Overall balance assessment: Needs assistance Sitting-balance support: No upper extremity supported;Feet supported Sitting balance-Leahy Scale: Good Sitting balance - Comments: steady sitting reaching within BOS   Standing balance support: Single extremity supported Standing balance-Leahy Scale: Fair Standing balance comment: pt requiring at least single UE support for static standing balance                             Pertinent Vitals/Pain Pain Assessment: Faces Faces Pain Scale: Hurts a little bit Pain Location:  pleuritic type pain with breathing at times Pain Intervention(s): Limited activity within patient's tolerance;Monitored during session;Repositioned    Home Living Family/patient expects to be discharged to:: Private residence Living Arrangements: Alone Available Help at Discharge: Family;Available 24 hours/day (pt reports she could stay with her daughter) Type of Home: House Home  Access: Stairs to enter   [employer] of Steps: 4 steps with R post and L rail at top Home Layout: Two level;Bed/bath upstairs;1/2 bath on main level (has been sleeping in recliner on main floor d/t being cooler downstairs and easier to breathe) Home Equipment: Shower seat      Prior Function Level of Independence: Independent         Comments: No home O2; no falls in past 6 months     Hand Dominance   Dominant Hand: Right    Extremity/Trunk Assessment   Upper Extremity Assessment Upper Extremity Assessment: Generalized weakness    Lower Extremity Assessment Lower Extremity Assessment: Generalized weakness    Cervical / Trunk Assessment Cervical / Trunk Assessment: Normal  Communication   Communication: No difficulties  Cognition Arousal/Alertness: Awake/alert Behavior During Therapy: WFL for tasks assessed/performed Overall Cognitive Status: Within Functional Limits for tasks assessed                                        General Comments   Nursing cleared pt for participation in physical therapy.  Pt agreeable to PT session.    Exercises    Assessment/Plan    PT Assessment Patient needs continued PT services  PT Problem List Decreased strength;Decreased activity tolerance;Decreased balance;Decreased mobility;Decreased knowledge of use of DME;Cardiopulmonary status limiting activity;Decreased knowledge of precautions       PT Treatment Interventions DME instruction;Gait training;Stair training;Functional mobility training;Therapeutic activities;Therapeutic exercise;Balance training;Patient/family education    PT Goals (Current goals can be found in the Care Plan section)  Acute Rehab PT Goals Patient Stated Goal: get better and go home PT Goal Formulation: With patient Time For Goal Achievement: 08/21/20 Potential to Achieve Goals: Good    Frequency Min 2X/week   Barriers to discharge        Co-evaluation                AM-PAC PT "6 Clicks" Mobility  Outcome Measure Help needed turning from your back to your side while in a flat bed without using bedrails?: None Help needed moving from lying on your back to sitting on the side of a flat bed without using bedrails?: None Help needed moving to and from a bed to a chair (including a wheelchair)?: A Little Help needed standing up from a chair using your arms (e.g., wheelchair or bedside chair)?: A Little Help needed to walk in hospital room?: A Little Help needed climbing 3-5 steps with a railing? : A Lot 6 Click Score: 19    End of Session Equipment Utilized During Treatment: Gait belt;Oxygen (2 L via nasal cannula) Activity Tolerance: Patient limited by fatigue;Other (comment) (Limited d/t SOB and increased HR with activity) Patient left: in chair;with call bell/phone within reach;with chair alarm set Nurse Communication: Mobility status;Precautions;Other (comment) (pt's HR and O2 sats during session) PT Visit Diagnosis: Other abnormalities of gait and mobility (R26.89);Muscle weakness (generalized) (M62.81);Difficulty in walking, not elsewhere classified (R26.2)    Time: 1610-9604 PT Time Calculation (min) (ACUTE ONLY): 26 min   Charges:   PT Evaluation $PT Eval Low  Complexity: 1 Low PT Treatments $Therapeutic Activity: 8-22 mins       Hendricks Limes, PT 08/07/20, 4:47 PM

## 2020-08-07 NOTE — Progress Notes (Signed)
PROGRESS NOTE    Erica Camacho  CXK:[id number] DOB: 1951/06/25 DOA: 08/05/2020 PCP: Grayland Jack, FNP   Brief Narrative:  69 year old female with history significant for diabetes, hypertension, COPD not on home oxygen, hypothyroidism who presents with shortness of breath and cough.  Found to be Covid positive.  Started on Covid specific treatments including remdesivir, baricitinib, steroids.  At time my evaluation this morning is dependent on 3 to 4 L nasal cannula.  Saturating well.  Speaking in complete sentences.  Mentating clearly.  Normal work of breathing.  We will continue current scope of care for now.  9/9: Patient seen and examined.  Clinically appears improved.  Titrated to 1 to 2 L nasal cannula.  Continues to endorse chest pressure and cough.   Assessment & Plan:   Principal Problem:   Pneumonia due to COVID-19 virus Active Problems:   Hypoxia   Diabetes mellitus type 2 in obese [facility])   Essential hypertension   COPD (chronic obstructive pulmonary disease) (HCC)  Multifocal pneumonia secondary to COVID-19 Mild hypoxic respiratory failure secondary to above Patient initially required 4 L nasal cannula Oxygen requirement has been titrated down to 1 2 L Patient clinically improved Inflammatory markers downtrending Plan: Continue remdesivir, 08/06/20- Continue steroids, 08/06/2020- Continue baricitinib, 08/06/2020- Prone as tolerated Stressed I-S and flutter use Lasix 40 mg IV x1 today Therapy evaluations If we are able to wean from supplemental oxygen and patient remains fever free anticipate discharge home on 08/08/2020  Type 2 diabetes mellitus Suboptimal control over interval Diabetes coordinator consult Plan: Levemir 9 units daily SSI before meals and at bedtime Nightly coverage Carb modified diet  Essential hypertension Continue home medications  Losartan Maxide  COPD Per patient mild COPD Uses albuterol nebulizer treatments at home as  needed Does not appear acutely exacerbated Plan: Continue albuterol MDI as needed Supplemental oxygen   DVT prophylaxis: Lovenox Code Status: Full Family Communication:  Disposition Plan: Status is: Inpatient  Remains inpatient appropriate because:Inpatient level of care appropriate due to severity of illness   Dispo: The patient is from: Home              Anticipated d/c is to: Home              Anticipated d/c date is: 1 day              Patient currently is not medically stable to d/c.   Hypoxia resolving.  Anticipate 1 additional day of inpatient monitoring and treatment.  Anticipate discharge home on 08/08/2020   Consultants:   None  Procedures:   None  Antimicrobials:   Remdesivir   Subjective: Seen and examined.  Endorses cough and pleuritic chest pain  Objective: Vitals:   08/06/20 2021 08/06/20 2345 08/07/20 0303 08/07/20 0743  BP: (!) 139/115 134/64 (!) 178/88 (!) 150/67  Pulse: 82 83 90 68  Resp: 18 18 20 17   Temp: 98.1 F (36.7 C) 98 F (36.7 C) 98 F (36.7 C) 98.2 F (36.8 C)  TempSrc: Oral Oral Oral   SpO2: 93% 95% 91% 98%  Weight:      Height:       No intake or output data in the 24 hours ending 08/07/20 1124 Filed Weights   08/06/20 0850  Weight: 86.2 kg    Examination:  General exam: Appears calm and comfortable  Respiratory system: Scattered bilateral crackles.  Normal work of breathing.  2 L nasal cannula Cardiovascular system: S1 & S2 heard, RRR. No JVD,  murmurs, rubs, gallops or clicks. No pedal edema. Gastrointestinal system: Abdomen is nondistended, soft and nontender. No organomegaly or masses felt. Normal bowel sounds heard. Central nervous system: Alert and oriented. No focal neurological deficits. Extremities: Symmetric 5 x 5 power. Skin: No rashes, lesions or ulcers Psychiatry: Judgement and insight appear normal. Mood & affect appropriate.     Data Reviewed: I have personally reviewed following labs and imaging  studies  CBC: Recent Labs  Lab 08/05/20 1400 08/07/20 0557  WBC 15.3* 22.8*  NEUTROABS 11.9* 18.8*  HGB 13.1 11.5*  HCT 41.2 36.9  MCV 81.1 83.3  PLT 375 381   Basic Metabolic Panel: Recent Labs  Lab 08/05/20 1400 08/07/20 0557  NA 134* 138  K 4.4 4.9  CL 96* 101  CO2 23 28  GLUCOSE 253* 257*  BUN 24* 25*  CREATININE 0.84 0.88  CALCIUM 8.9 9.1   GFR: Estimated Creatinine Clearance: 61.9 mL/min (by C-G formula based on SCr of 0.88 mg/dL). Liver Function Tests: Recent Labs  Lab 08/05/20 1400 08/07/20 0557  AST 20 18  ALT 12 13  ALKPHOS 52 47  BILITOT 1.0 0.8  PROT 7.7 7.2  ALBUMIN 3.7 3.2*   No results for input(s): LIPASE, AMYLASE in the last 168 hours. No results for input(s): AMMONIA in the last 168 hours. Coagulation Profile: Recent Labs  Lab 08/05/20 2246  INR 1.1   Cardiac Enzymes: No results for input(s): CKTOTAL, CKMB, CKMBINDEX, TROPONINI in the last 168 hours. BNP (last 3 results) No results for input(s): PROBNP in the last 8760 hours. HbA1C: Recent Labs    08/06/20 0201  HGBA1C 11.0*   CBG: Recent Labs  Lab 08/06/20 1255 08/06/20 1736 08/06/20 2131 08/06/20 2346 08/07/20 0742  GLUCAP 301* 268* 276* 243* 249*   Lipid Profile: No results for input(s): CHOL, HDL, LDLCALC, TRIG, CHOLHDL, LDLDIRECT in the last 72 hours. Thyroid Function Tests: No results for input(s): TSH, T4TOTAL, FREET4, T3FREE, THYROIDAB in the last 72 hours. Anemia Panel: Recent Labs    08/06/20 0044 08/07/20 0557  FERRITIN 184 196   Sepsis Labs: Recent Labs  Lab 08/05/20 1400 08/05/20 2246  PROCALCITON  --  <0.10  LATICACIDVEN 1.7  --     Recent Results (from the past 240 hour(s))  SARS Coronavirus 2 by RT PCR (hospital order, performed in [facility] hospital lab) Nasopharyngeal Nasopharyngeal Swab     Status: Abnormal   Collection Time: 08/05/20  9:38 PM   Specimen: Nasopharyngeal Swab  Result Value Ref Range Status   SARS Coronavirus 2 POSITIVE  (A) NEGATIVE Final    Comment: RESULT CALLED TO, READ BACK BY AND VERIFIED WITH: Duncan Dull RN 0023 08/06/20 HNM (NOTE) SARS-CoV-2 target nucleic acids are DETECTED  SARS-CoV-2 RNA is generally detectable in upper respiratory specimens  during the acute phase of infection.  Positive results are indicative  of the presence of the identified virus, but do not rule out bacterial infection or co-infection with other pathogens not detected by the test.  Clinical correlation with patient history and  other diagnostic information is necessary to determine patient infection status.  The expected result is negative.  Fact Sheet for Patients:   BoilerBrush.com.cy   Fact Sheet for Healthcare Providers:   https://pope.com/    This test is not yet approved or cleared by the [place] FDA and  has been authorized for detection and/or diagnosis of SARS-CoV-2 by FDA under an Emergency Use Authorization (EUA).  This EUA will remain in effect (meaning  this test  can be used) for the duration of  the COVID-19 declaration under Section 564(b)(1) of the Act, 21 U.S.C. section 360-bbb-3(b)(1), unless the authorization is terminated or revoked sooner.  Performed at [facility], [address] [place], [place] [address]   Blood culture (routine single)     Status: None (Preliminary result)   Collection Time: 08/05/20 10:46 PM   Specimen: BLOOD  Result Value Ref Range Status   Specimen Description BLOOD RT HAND  Final   Special Requests   Final    BOTTLES DRAWN AEROBIC AND ANAEROBIC Blood Culture adequate volume   Culture   Final    NO GROWTH 2 DAYS Performed at [facility], [address] [place], [place] [address]    Report Status PENDING  Incomplete  Urine culture     Status: Abnormal   Collection Time: 08/06/20  9:16 AM   Specimen: Urine, Random  Result Value Ref Range Status   Specimen Description   Final     URINE, RANDOM Performed at [facility], [address], [place], [place] [address]    Special Requests   Final    NONE Performed at [facility], [address], [place], [place] [address]    Culture (A)  Final    <10,000 COLONIES/mL INSIGNIFICANT GROWTH Performed at [facility], [address], [place], [place] [address]    Report Status 08/07/2020 FINAL  Final         Radiology Studies: DG Chest 2 View  Result Date: 08/05/2020 CLINICAL DATA:  Shortness of breath. EXAM: CHEST - 2 VIEW COMPARISON:  09/25/2018. FINDINGS: Mediastinum hilar structures normal. Heart size normal. Diffuse prominent bilateral pulmonary interstitial infiltrates consistent with pneumonitis noted. Question patient's COVID status. No pleural effusion or pneumothorax. IMPRESSION: Diffuse prominent bilateral pulmonary interstitial infiltrates consistent pneumonitis noted. Question patient's COVID status. Electronically Signed   By: Maisie Fus  Register   On: 08/05/2020 14:24        Scheduled Meds: . vitamin C  500 mg Oral Daily  . atorvastatin  40 mg Oral Daily  . baricitinib  4 mg Oral Daily  . enoxaparin (LOVENOX) injection  40 mg Subcutaneous Q24H  . feeding supplement (ENSURE ENLIVE)  237 mL Oral BID BM  . furosemide  40 mg Intravenous Once  . insulin aspart  0-15 Units Subcutaneous TID WC  . insulin aspart  0-5 Units Subcutaneous QHS  . insulin detemir  9 Units Subcutaneous Daily  . levothyroxine  75 mcg Oral QAC breakfast  . linagliptin  5 mg Oral Daily  . losartan  100 mg Oral Daily  . methylPREDNISolone (SOLU-MEDROL) injection  1 mg/kg Intravenous Q12H   Followed by  . [START ON 08/09/2020] predniSONE  50 mg Oral Daily  . sodium chloride flush  10-40 mL Intracatheter Q12H  . triamterene-hydrochlorothiazide  1 tablet Oral Daily  . zinc sulfate  220 mg Oral Daily   Continuous Infusions: . remdesivir 100 mg in NS 100 mL 100 mg (08/07/20 0852)     LOS: 1 day    Time  spent: 25 minutes    Tresa Moore, MD [facility] Pager [phone]  If 7PM-7AM, please contact night-coverage 08/07/2020, 11:24 AM

## 2020-08-07 NOTE — Progress Notes (Signed)
Inpatient Diabetes Program Recommendations  AACE/ADA: New Consensus Statement on Inpatient Glycemic Control (2015)  Target Ranges:  Prepandial:   less than 140 mg/dL      Peak postprandial:   less than 180 mg/dL (1-2 hours)      Critically ill patients:  140 - 180 mg/dL   Lab Results  Component Value Date   GLUCAP 249 (H) 08/07/2020   HGBA1C 11.0 (H) 08/06/2020    Review of Glycemic Control Results for Erica Camacho, Erica Camacho (MRN [id number]) as of 08/07/2020 09:34  Ref. Range 08/06/2020 02:09 08/06/2020 09:23 08/06/2020 12:55 08/06/2020 17:36 08/06/2020 21:31 08/06/2020 23:46 08/07/2020 07:42  Glucose-Capillary Latest Ref Range: 70 - 99 mg/dL 898 (H) 421 (H) 031 (H) 268 (H) 276 (H) 243 (H) 249 (H)   Diabetes history: DM2 Outpatient Diabetes medications: Amaryl 4 mg daily, Metformin 500 mg BID, Actos 30 mg daily, Januvia 100 mg daily Current orders for Inpatient glycemic control: Novolog 0-15 units TID with meals, Novolog 0-5 units QHS, Tradjenta 5 mg daily; Prednisone 50 mg daily, Solumedrol 86.25 mg Q12H  Inpatient Diabetes Program Recommendations:    Insulin: If steroids are continued as ordered please consider ordering Levemir 9 units Q24H (based on 86.2 kg x 0.1 units). Secure chat sent to Dr. Rudene Christians.  Thank you, Billy Fischer. Treacy Holcomb, RN, MSN, CDE  Diabetes Coordinator Inpatient Glycemic Control Team Team Pager [phone] (8am-5pm) 08/07/2020 9:35 AM

## 2020-08-07 NOTE — Progress Notes (Signed)
Initial Nutrition Assessment  DOCUMENTATION CODES:   Obesity unspecified  INTERVENTION:  Plan is to liberalize diet to carbohydrate modified.  Increase to Ensure Enlive po TID, each supplement provides 350 kcal and 20 grams of protein.  Pt would benefit from nutrient dense supplement. One Ensure Enlive supplement provides 350 kcals, 20 grams protein, and 44-45 grams of carbohydrate vs one Glucerna shake supplement, which provides 220 kcals, 10 grams of protein, and 26 grams of carbohydrate. Given pt's hx of DM, RD will reassess adequacy of PO intake, CBGS, and adjust supplement regimen as appropriate at follow-up.   NUTRITION DIAGNOSIS:   Increased nutrient needs related to catabolic illness ([id number]) as evidenced by estimated needs.  GOAL:   Patient will meet greater than or equal to 90% of their needs  MONITOR:   PO intake, Supplement acceptance, Labs, Weight trends, I & O's  REASON FOR ASSESSMENT:   Malnutrition Screening Tool    ASSESSMENT:   69 year old female with PMHx of HTN, DM, HLD, hypothyroidism, COPD admitted with multifocal PNA due to COVID-19.   Met with patient at bedside. She reports she has had decreased appetite and intake for the past 2 weeks. She reports she was still forcing herself to eat but was not eating well. Unable to provide exact details on intake. She reports she is eating a little better today. She had finished almost all of her lunch tray at time of RD assessment. Patient already ordered for Ensure Enlive. Patient is amenable to continue drinking these to help meet calorie/protein needs.  Patient reports her UBW was 198 lbs and that she lost down to 190 lbs over approximately 2 weeks. This is a reported weight loss of 8 lbs (4% body weight) over 2 weeks, which is significant for time frame.   Medications reviewed and include: vitamin C 500 mg daily, Novolog 0-15 units TID, Novolog 0-5 units QHS, Levemir 9 units daily, levothyroxine, Tradjenta,  Solu-Medrol 1 mg/kg Q12hrs IV, zinc sulfate 220 mg daily, remdesivir.  Labs reviewed: CBG 243-339, BUN 25.  Patient is at risk for acute malnutrition related to COVID-19.  Per discussion with MD via secure chat plan is to liberalize diet to carbohydrate modified.  NUTRITION - FOCUSED PHYSICAL EXAM:    Most Recent Value  Orbital Region No depletion  Upper Arm Region Mild depletion  Thoracic and Lumbar Region No depletion  Buccal Region No depletion  Temple Region No depletion  Clavicle Bone Region No depletion  Clavicle and Acromion Bone Region No depletion  Scapular Bone Region No depletion  Dorsal Hand No depletion  Patellar Region No depletion  Anterior Thigh Region No depletion  Posterior Calf Region Mild depletion  Hair Reviewed  Eyes Reviewed  Mouth Reviewed  Skin Reviewed  Nails Reviewed     Diet Order:   Diet Order            Diet heart healthy/carb modified Room service appropriate? Yes; Fluid consistency: Thin  Diet effective now                EDUCATION NEEDS:   No education needs have been identified at this time  Skin:  Skin Assessment: Reviewed RN Assessment  Last BM:  08/06/2020 per chart  Height:   Ht Readings from Last 1 Encounters:  08/06/20 _0  (1.549 m)   Weight:   Wt Readings from Last 1 Encounters:  08/06/20 86.2 kg   BMI:  Body mass index is 35.9 kg/m.  Estimated Nutritional Needs:  Kcal:  2100-2300  Protein:  105-115 grams  Fluid:  >/= 2 L/day  Jacklynn Barnacle, MS, RD, LDN Pager number available on Amion

## 2020-08-07 NOTE — Evaluation (Signed)
Occupational Therapy Evaluation Patient Details Name: Erica Camacho MRN: [id number] DOB: 05-06-1951 Today's Date: 08/07/2020    History of Present Illness 69 year old female with history significant for diabetes, hypertension, COPD not on home oxygen, hypothyroidism who presents with shortness of breath and cough.  Found to be Covid positive.   Clinical Impression   Pt seen for OT evaluation this date. Prior to recent Covid-19 diagnosis, pt was independent in all ADL and functional mobility, living in a 2 story townhome with bed/bath upstairs (has powder room on main floor). Pt reports she has family she can stay with after hospitalization should she need additional assist. Pt does not need O2 at home. Pt currently on 2L of O2 at time of evaluation. VSS at rest. Pt reports coughing that causes discomfort in her chest but otherwise no pain. Pt performed bed mobility with CGA, STS and SPT t/f to [facility] with handheld assist and cues for hand placement to reach back for BSC arm rail for improved safety. Pt unsteady and reports feeling weak. Strength testing reveals no significant deficits. Activity tolerance has been compromised by Covid, as pt reports increased fatigue after minimal exertion. Cues for PLB to help breathe recovery. HR into 120's and O2 briefly down to 88%-89% on 2L O2 and recovers to >93% within 1-2 minutes. Pt currently requires additional time/effort to perform ADL tasks with CGA for standing/ADL mobility. Pt educated in IS and flutter valve use to improve lung function with pt able to return demo with additional instruction. Pt also educated in falls prevention strategies and energy conservation strategies including activity pacing, PLB, work simplificationenergy conservation strategies including pursed lip breathing, activity pacing, home/routines modifications, work simplification, AE/DME, prioritizing of meaningful occupations, and falls prevention. Pt able to return demo PLB requiring  occasional cues to initiate use of PLB and cues for rest breaks. Pt verbalized understanding and would benefit from additional skilled OT services to maximize recall and carryover of learned techniques and facilitate implementation of learned techniques into daily routines. Upon discharge, recommend HHOT services.      Follow Up Recommendations  Home health OT;Supervision - Intermittent    Equipment Recommendations  3 in 1 bedside commode    Recommendations for Other Services       Precautions / Restrictions Precautions Precautions: Fall Precaution Comments: monitor vitals Restrictions Weight Bearing Restrictions: No      Mobility Bed Mobility Overal bed mobility: Needs Assistance Bed Mobility: Supine to Sit;Sit to Supine     Supine to sit: Supervision Sit to supine: Supervision      Transfers Overall transfer level: Needs assistance Equipment used: 1 person hand held assist Transfers: Sit to/from Stand;Stand Pivot Transfers Sit to Stand: Min guard Stand pivot transfers: Min guard       General transfer comment: unsteady, cues for safety    Balance Overall balance assessment: Needs assistance Sitting-balance support: No upper extremity supported;Feet supported Sitting balance-Leahy Scale: Good     Standing balance support: Single extremity supported;During functional activity Standing balance-Leahy Scale: Fair                             ADL either performed or assessed with clinical judgement   ADL Overall ADL's : Needs assistance/impaired  General ADL Comments: Pt requires CGA for tiolet transfers to [facility], able to perform clothing mgt and hygiene with set up and CGA in standing; CGA for STS LB ADL tasks to complete. Requires rest breaks and cues to rest for breath recovery     Vision         Perception     Praxis      Pertinent Vitals/Pain Pain Assessment: No/denies pain     Hand  Dominance Right   Extremity/Trunk Assessment Upper Extremity Assessment Upper Extremity Assessment: Overall WFL for tasks assessed   Lower Extremity Assessment Lower Extremity Assessment: Overall WFL for tasks assessed   Cervical / Trunk Assessment Cervical / Trunk Assessment: Normal   Communication Communication Communication: No difficulties   Cognition Arousal/Alertness: Awake/alert Behavior During Therapy: WFL for tasks assessed/performed Overall Cognitive Status: Within Functional Limits for tasks assessed                                     General Comments       Exercises Other Exercises Other Exercises: Pt instructed in IS and flutter valve use with pt able to return demo with additional instruction; falls prevention; and energy conservation strategies including activity pacing, PLB, work simplification   Shoulder Instructions      Home Living Family/patient expects to be discharged to:: Private residence Living Arrangements: Alone Available Help at Discharge: Family;Available 24 hours/day (dtr - pt reports she can stay with dtr 24/7 if needed or pastor) Type of Home: House Home Access: Stairs to enter Entrance Stairs-Number of Steps: 4   Home Layout: Two level;Bed/bath upstairs;1/2 bath on main level Alternate Level Stairs-Number of Steps: 12 steps Alternate Level Stairs-Rails: Right;Left Bathroom Shower/Tub: Chief Strategy Officer: Standard     Home Equipment: Shower seat          Prior Functioning/Environment Level of Independence: Independent        Comments: no O2, indep in all aspects, drive short distances        OT Problem List: Cardiopulmonary status limiting activity;Decreased safety awareness;Decreased activity tolerance;Impaired balance (sitting and/or standing);Decreased knowledge of use of DME or AE      OT Treatment/Interventions: Self-care/ADL training;Therapeutic exercise;Therapeutic activities;DME and/or  AE instruction;Energy conservation;Patient/family education;Balance training    OT Goals(Current goals can be found in the care plan section) Acute Rehab OT Goals Patient Stated Goal: get better and go home OT Goal Formulation: With patient Time For Goal Achievement: 08/21/20 Potential to Achieve Goals: Good  OT Frequency: Min 1X/week   Barriers to D/C:            Co-evaluation              AM-PAC OT "6 Clicks" Daily Activity     Outcome Measure Help from another person eating meals?: None Help from another person taking care of personal grooming?: A Little Help from another person toileting, which includes using toliet, bedpan, or urinal?: A Little Help from another person bathing (including washing, rinsing, drying)?: A Little Help from another person to put on and taking off regular upper body clothing?: None Help from another person to put on and taking off regular lower body clothing?: A Little 6 Click Score: 20   End of Session    Activity Tolerance: Patient tolerated treatment well Patient left: in bed;with call bell/phone within reach;with bed alarm set  OT Visit Diagnosis: Other abnormalities of gait  and mobility (R26.89)                Time: 1062-6948 OT Time Calculation (min): 31 min Charges:  OT General Charges $OT Visit: 1 Visit OT Evaluation $OT Eval Moderate Complexity: 1 Mod OT Treatments $Self Care/Home Management : 23-37 mins  Richrd Prime, MPH, MS, OTR/L ascom [phone] 08/07/20, 2:32 PM

## 2020-08-07 NOTE — Progress Notes (Signed)
CH visited pt. per RN suggestion; pt. lying back in bed, initially talking on phone but awake and pleasant when [facility] returned; pt. shared that she caught COVID at an event with friends from church (difficult to understand full story as it was hard to hear in rm.); pt. says her faith in God has been essential in her ability to cope with her illness, and she has felt God's presence and assurance as she prays during this hospitalization.  Pt. very grateful for [facility] visit; promises to keep entire hospital in her prayers; no needs expressed at this time.  CH may follow up if possible.

## 2020-08-08 LAB — CBC WITH DIFFERENTIAL/PLATELET
Abs Immature Granulocytes: 0.59 10*3/uL — ABNORMAL HIGH (ref 0.00–0.07)
Basophils Absolute: 0 10*3/uL (ref 0.0–0.1)
Basophils Relative: 0 %
Eosinophils Absolute: 0 10*3/uL (ref 0.0–0.5)
Eosinophils Relative: 0 %
HCT: 36.7 % (ref 36.0–46.0)
Hemoglobin: 11.9 g/dL — ABNORMAL LOW (ref 12.0–15.0)
Immature Granulocytes: 3 %
Lymphocytes Relative: 6 %
Lymphs Abs: 1.2 10*3/uL (ref 0.7–4.0)
MCH: 25.9 pg — ABNORMAL LOW (ref 26.0–34.0)
MCHC: 32.4 g/dL (ref 30.0–36.0)
MCV: 79.8 fL — ABNORMAL LOW (ref 80.0–100.0)
Monocytes Absolute: 1.1 10*3/uL — ABNORMAL HIGH (ref 0.1–1.0)
Monocytes Relative: 5 %
Neutro Abs: 18.1 10*3/uL — ABNORMAL HIGH (ref 1.7–7.7)
Neutrophils Relative %: 86 %
Platelets: 460 10*3/uL — ABNORMAL HIGH (ref 150–400)
RBC: 4.6 MIL/uL (ref 3.87–5.11)
RDW: 15 % (ref 11.5–15.5)
WBC: 20.9 10*3/uL — ABNORMAL HIGH (ref 4.0–10.5)
nRBC: 0 % (ref 0.0–0.2)

## 2020-08-08 LAB — GLUCOSE, CAPILLARY
Glucose-Capillary: 356 mg/dL — ABNORMAL HIGH (ref 70–99)
Glucose-Capillary: 422 mg/dL — ABNORMAL HIGH (ref 70–99)
Glucose-Capillary: 403 mg/dL — ABNORMAL HIGH (ref 70–99)
Glucose-Capillary: 448 mg/dL — ABNORMAL HIGH (ref 70–99)
Glucose-Capillary: 340 mg/dL — ABNORMAL HIGH (ref 70–99)
Glucose-Capillary: 190 mg/dL — ABNORMAL HIGH (ref 70–99)
Glucose-Capillary: 150 mg/dL — ABNORMAL HIGH (ref 70–99)

## 2020-08-08 LAB — COMPREHENSIVE METABOLIC PANEL
ALT: 13 U/L (ref 0–44)
AST: 14 U/L — ABNORMAL LOW (ref 15–41)
Albumin: 3.2 g/dL — ABNORMAL LOW (ref 3.5–5.0)
Alkaline Phosphatase: 50 U/L (ref 38–126)
Anion gap: 9 (ref 5–15)
BUN: 37 mg/dL — ABNORMAL HIGH (ref 8–23)
CO2: 26 mmol/L (ref 22–32)
Calcium: 9.1 mg/dL (ref 8.9–10.3)
Chloride: 97 mmol/L — ABNORMAL LOW (ref 98–111)
Creatinine, Ser: 1.07 mg/dL — ABNORMAL HIGH (ref 0.44–1.00)
GFR calc Af Amer: >60 mL/min (ref >60)
GFR calc non Af Amer: 54 mL/min — ABNORMAL LOW (ref >60)
Glucose, Bld: 412 mg/dL — ABNORMAL HIGH (ref 70–99)
Potassium: 4.9 mmol/L (ref 3.5–5.1)
Sodium: 132 mmol/L — ABNORMAL LOW (ref 135–145)
Total Bilirubin: 0.8 mg/dL (ref 0.3–1.2)
Total Protein: 7.2 g/dL (ref 6.5–8.1)

## 2020-08-08 LAB — FERRITIN: Ferritin: 209 ng/mL (ref 11–307)

## 2020-08-08 LAB — C-REACTIVE PROTEIN: CRP: 5.5 mg/dL — ABNORMAL HIGH (ref <1.0)

## 2020-08-08 LAB — FIBRIN DERIVATIVES D-DIMER (ARMC ONLY): Fibrin derivatives D-dimer (ARMC): 465.48 ng/mL (FEU) (ref 0.00–499.00)

## 2020-08-08 MED ORDER — INSULIN ASPART 100 UNIT/ML ~~LOC~~ SOLN
0.0000 [IU] | SUBCUTANEOUS | Status: DC
  Administered 2020-08-08: 17:00:00 15 [IU] via SUBCUTANEOUS
  Administered 2020-08-08: 4 [IU] via SUBCUTANEOUS
  Administered 2020-08-09: 18:00:00 20 [IU] via SUBCUTANEOUS
  Administered 2020-08-09: 4 [IU] via SUBCUTANEOUS
  Administered 2020-08-09: 15 [IU] via SUBCUTANEOUS
  Administered 2020-08-09: 05:00:00 4 [IU] via SUBCUTANEOUS
  Administered 2020-08-09: 15 [IU] via SUBCUTANEOUS
  Administered 2020-08-09: 3 [IU] via SUBCUTANEOUS
  Administered 2020-08-10: 15 [IU] via SUBCUTANEOUS
  Administered 2020-08-10: 01:00:00 7 [IU] via SUBCUTANEOUS
  Administered 2020-08-10: 13:00:00 11 [IU] via SUBCUTANEOUS
  Filled 2020-08-08 (×12): qty 1

## 2020-08-08 MED ORDER — INSULIN ASPART 100 UNIT/ML ~~LOC~~ SOLN
3.0000 [IU] | Freq: Three times a day (TID) | SUBCUTANEOUS | Status: DC
  Administered 2020-08-08: 13:00:00 3 [IU] via SUBCUTANEOUS
  Filled 2020-08-08: qty 1

## 2020-08-08 MED ORDER — INSULIN ASPART 100 UNIT/ML ~~LOC~~ SOLN
8.0000 [IU] | Freq: Three times a day (TID) | SUBCUTANEOUS | Status: DC
  Administered 2020-08-08: 8 [IU] via SUBCUTANEOUS
  Filled 2020-08-08: qty 1

## 2020-08-08 MED ORDER — INSULIN ASPART 100 UNIT/ML ~~LOC~~ SOLN
15.0000 [IU] | Freq: Once | SUBCUTANEOUS | Status: AC
  Administered 2020-08-08: 13:00:00 15 [IU] via SUBCUTANEOUS
  Filled 2020-08-08: qty 1

## 2020-08-08 MED ORDER — INSULIN DETEMIR 100 UNIT/ML ~~LOC~~ SOLN
12.0000 [IU] | Freq: Every day | SUBCUTANEOUS | Status: DC
  Administered 2020-08-08 – 2020-08-09 (×2): 12 [IU] via SUBCUTANEOUS
  Filled 2020-08-08 (×3): qty 0.12

## 2020-08-08 MED ORDER — INSULIN ASPART 100 UNIT/ML ~~LOC~~ SOLN
15.0000 [IU] | Freq: Once | SUBCUTANEOUS | Status: AC
  Administered 2020-08-08: 15:00:00 15 [IU] via SUBCUTANEOUS
  Filled 2020-08-08: qty 1

## 2020-08-08 NOTE — Progress Notes (Signed)
PT Cancellation Note  Patient Details Name: Erica Camacho MRN: [id number] DOB: 1951-05-01   Cancelled Treatment:    Reason Eval/Treat Not Completed: Patient not medically ready.  Chart reviewed.  Pt's last 3 blood glucose readings were 422, 403, and then 448.  Per PT guidelines for elevated blood glucose, therapy contraindicated at this time.  Will re-attempt PT treatment at a later date/time as medically appropriate.  Hendricks Limes, PT 08/08/20, 3:07 PM

## 2020-08-08 NOTE — Progress Notes (Addendum)
PROGRESS NOTE    Erica Camacho  DPO:[id number] DOB: Aug 16, 1951 DOA: 08/05/2020 PCP: Grayland Jack, FNP   Brief Narrative:  69 year old female with history significant for diabetes, hypertension, COPD not on home oxygen, hypothyroidism who presents with shortness of breath and cough.  Found to be Covid positive.  Started on Covid specific treatments including remdesivir, baricitinib, steroids.  At time my evaluation this morning is dependent on 3 to 4 L nasal cannula.  Saturating well.  Speaking in complete sentences.  Mentating clearly.  Normal work of breathing.  We will continue current scope of care for now.  9/9: Patient seen and examined.  Clinically appears improved.  Titrated to 1 to 2 L nasal cannula.  Continues to endorse chest pressure and cough.  9/10: Patient seen and examined.  Clinically the patient has been titrated off of nasal cannula.  She is mid 90s on room air at rest.  However she has rapid desaturation and extreme fatigue upon minimal exertion.   Assessment & Plan:   Principal Problem:   Pneumonia due to COVID-19 virus Active Problems:   Hypoxia   Diabetes mellitus type 2 in obese [facility])   Essential hypertension   COPD (chronic obstructive pulmonary disease) (HCC)  Multifocal pneumonia secondary to COVID-19 Mild hypoxic respiratory failure secondary to above Patient initially required 4 L nasal cannula Oxygen requirement titrated off at room air Continues to require some oxygen upon ambulation Very rapid fatigue upon minimal exertion Inflammatory markers downtrending Plan: Continue remdesivir, 08/06/20-08/09/2020 Continue steroids, 08/06/2020- Continue baricitinib, 08/06/2020- Prone as tolerated Stressed I-S and flutter use No further Lasix today Therapy evaluations-recommend skilled nursing facility.  Patient declined.  Wants to go home.  Home health ordered. -Tentative plan to discharge home on 08/09/2020 if patient remains on room air, fever free, energy  level improved  Type 2 diabetes mellitus Suboptimal control over interval Diabetes coordinator consult Plan: Levemir 12 units daily NovoLog 3 units times daily with meals 0-20 units SSI every 4 hours Nightly coverage Carb modified diet  Essential hypertension Continue home medications  Losartan Maxide  COPD Per patient mild COPD Uses albuterol nebulizer treatments at home as needed Does not appear acutely exacerbated Plan: Continue albuterol MDI as needed Supplemental oxygen   DVT prophylaxis: Lovenox Code Status: Full Family Communication: Left VM for daughter Laurelyn Sickle [phone] on 08/08/2020 Disposition Plan: Status is: Inpatient  Remains inpatient appropriate because:Inpatient level of care appropriate due to severity of illness   Dispo: The patient is from: Home              Anticipated d/c is to: Home              Anticipated d/c date is: 1 day              Patient currently is not medically stable to d/c.   Hypoxia resolving.  Patient severely fatigued.  Anticipate 1 additional day of inpatient monitoring and treatment.  Anticipate discharge home on 08/09/2020   Consultants:   None  Procedures:   None  Antimicrobials:   Remdesivir   Subjective: Seen and examined.  Endorsing severe fatigue  Objective: Vitals:   08/07/20 1950 08/08/20 0001 08/08/20 0457 08/08/20 0735  BP: 130/74 (!) 128/53 132/64 (!) 150/72  Pulse: 83 64 66 69  Resp: 18 20 16 18   Temp: 98 F (36.7 C) 98.2 F (36.8 C) 97.7 F (36.5 C) 98 F (36.7 C)  TempSrc: Oral Oral Oral Oral  SpO2: 98% 95% 97% 95%  Weight:      Height:        Intake/Output Summary (Last 24 hours) at 08/08/2020 1202 Last data filed at 08/08/2020 0900 Gross per 24 hour  Intake 436.6 ml  Output --  Net 436.6 ml   Filed Weights   08/06/20 0850  Weight: 86.2 kg    Examination:  General: No acute distress.  Patient appears fatigued HEENT: Normocephalic, atraumatic Neck, supple, trachea midline,  no tenderness Heart: Regular rate and rhythm, S1/S2 normal, no murmurs Lungs: Tachypneic, scattered bilateral crackles, normal work of breathing, room air Abdomen: Soft, nontender, nondistended, positive bowel sounds Extremities: Normal, atraumatic, no clubbing or cyanosis, normal muscle tone Skin: No rashes or lesions, normal color Neurologic: Cranial nerves grossly intact, sensation intact, alert and oriented x3 Psychiatric: Normal affect     Data Reviewed: I have personally reviewed following labs and imaging studies  CBC: Recent Labs  Lab 08/05/20 1400 08/07/20 0557 08/08/20 0556  WBC 15.3* 22.8* 20.9*  NEUTROABS 11.9* 18.8* 18.1*  HGB 13.1 11.5* 11.9*  HCT 41.2 36.9 36.7  MCV 81.1 83.3 79.8*  PLT 375 381 460*   Basic Metabolic Panel: Recent Labs  Lab 08/05/20 1400 08/07/20 0557 08/08/20 0556  NA 134* 138 132*  K 4.4 4.9 4.9  CL 96* 101 97*  CO2 23 28 26   GLUCOSE 253* 257* 412*  BUN 24* 25* 37*  CREATININE 0.84 0.88 1.07*  CALCIUM 8.9 9.1 9.1   GFR: Estimated Creatinine Clearance: 50.9 mL/min (A) (by C-G formula based on SCr of 1.07 mg/dL (H)). Liver Function Tests: Recent Labs  Lab 08/05/20 1400 08/07/20 0557 08/08/20 0556  AST 20 18 14*  ALT 12 13 13   ALKPHOS 52 47 50  BILITOT 1.0 0.8 0.8  PROT 7.7 7.2 7.2  ALBUMIN 3.7 3.2* 3.2*   No results for input(s): LIPASE, AMYLASE in the last 168 hours. No results for input(s): AMMONIA in the last 168 hours. Coagulation Profile: Recent Labs  Lab 08/05/20 2246  INR 1.1   Cardiac Enzymes: No results for input(s): CKTOTAL, CKMB, CKMBINDEX, TROPONINI in the last 168 hours. BNP (last 3 results) No results for input(s): PROBNP in the last 8760 hours. HbA1C: Recent Labs    08/06/20 0201  HGBA1C 11.0*   CBG: Recent Labs  Lab 08/07/20 0742 08/07/20 1155 08/07/20 1618 08/07/20 2122 08/08/20 0741  GLUCAP 249* 339* 367* 354* 356*   Lipid Profile: No results for input(s): CHOL, HDL, LDLCALC, TRIG,  CHOLHDL, LDLDIRECT in the last 72 hours. Thyroid Function Tests: No results for input(s): TSH, T4TOTAL, FREET4, T3FREE, THYROIDAB in the last 72 hours. Anemia Panel: Recent Labs    08/07/20 0557 08/08/20 0556  FERRITIN 196 209   Sepsis Labs: Recent Labs  Lab 08/05/20 1400 08/05/20 2246  PROCALCITON  --  <0.10  LATICACIDVEN 1.7  --     Recent Results (from the past 240 hour(s))  SARS Coronavirus 2 by RT PCR (hospital order, performed in [facility] hospital lab) Nasopharyngeal Nasopharyngeal Swab     Status: Abnormal   Collection Time: 08/05/20  9:38 PM   Specimen: Nasopharyngeal Swab  Result Value Ref Range Status   SARS Coronavirus 2 POSITIVE (A) NEGATIVE Final    Comment: RESULT CALLED TO, READ BACK BY AND VERIFIED WITH: [facility] RN [id number] 08/06/20 HNM (NOTE) SARS-CoV-2 target nucleic acids are DETECTED  SARS-CoV-2 RNA is generally detectable in upper respiratory specimens  during the acute phase of infection.  Positive results are indicative  of the presence  of the identified virus, but do not rule out bacterial infection or co-infection with other pathogens not detected by the test.  Clinical correlation with patient history and  other diagnostic information is necessary to determine patient infection status.  The expected result is negative.  Fact Sheet for Patients:   BoilerBrush.com.cy   Fact Sheet for Healthcare Providers:   https://pope.com/    This test is not yet approved or cleared by the [place] FDA and  has been authorized for detection and/or diagnosis of SARS-CoV-2 by FDA under an Emergency Use Authorization (EUA).  This EUA will remain in effect (meaning this test  can be used) for the duration of  the COVID-19 declaration under Section 564(b)(1) of the Act, 21 U.S.C. section 360-bbb-3(b)(1), unless the authorization is terminated or revoked sooner.  Performed at [facility], [address] [place], [place] [address]   Blood culture (routine single)     Status: None (Preliminary result)   Collection Time: 08/05/20 10:46 PM   Specimen: BLOOD  Result Value Ref Range Status   Specimen Description BLOOD RT HAND  Final   Special Requests   Final    BOTTLES DRAWN AEROBIC AND ANAEROBIC Blood Culture adequate volume   Culture   Final    NO GROWTH 3 DAYS Performed at [facility], [address] [place], [place] [address]    Report Status PENDING  Incomplete  Urine culture     Status: Abnormal   Collection Time: 08/06/20  9:16 AM   Specimen: Urine, Random  Result Value Ref Range Status   Specimen Description   Final    URINE, RANDOM Performed at [facility], [address], [place], [place] [address]    Special Requests   Final    NONE Performed at [facility], [address], [place], [place] [address]    Culture (A)  Final    <10,000 COLONIES/mL INSIGNIFICANT GROWTH Performed at [facility], [address] [place], [place] [address]    Report Status 08/07/2020 FINAL  Final         Radiology Studies: No results found.      Scheduled Meds: . vitamin C  500 mg Oral Daily  . atorvastatin  40 mg Oral Daily  . baricitinib  4 mg Oral Daily  . enoxaparin (LOVENOX) injection  40 mg Subcutaneous Q24H  . feeding supplement (ENSURE ENLIVE)  237 mL Oral TID BM  . insulin aspart  0-15 Units Subcutaneous TID WC  . insulin aspart  0-5 Units Subcutaneous QHS  . insulin aspart  3 Units Subcutaneous TID WC  . insulin detemir  12 Units Subcutaneous Daily  . levothyroxine  150 mcg Oral QAC breakfast  . linagliptin  5 mg Oral Daily  . losartan  100 mg Oral Daily  . methylPREDNISolone (SOLU-MEDROL) injection  1 mg/kg Intravenous Q12H   Followed by  . [START ON 08/09/2020] predniSONE  50 mg Oral Daily  . sodium chloride flush  10-40 mL Intracatheter Q12H  . triamterene-hydrochlorothiazide  1 tablet Oral Daily  . zinc  sulfate  220 mg Oral Daily   Continuous Infusions: . remdesivir 100 mg in NS 100 mL 100 mg (08/08/20 0853)     LOS: 2 days    Time spent: 25 minutes    Tresa Moore, MD [facility] Pager [phone]  If 7PM-7AM, please contact night-coverage 08/08/2020, 12:02 PM

## 2020-08-08 NOTE — Care Management Important Message (Signed)
Important Message  Patient Details  Name: Erica Camacho MRN: [id number] Date of Birth: 1951-09-11   Medicare Important Message Given:  Yes  Initial Medicare IM given by Patient Access Associate on 08/07/2020 at 10:24am.     Johnell Comings 08/08/2020, 5:48 PM

## 2020-08-08 NOTE — TOC Initial Note (Signed)
Transition of Care [facility]) - Initial/Assessment Note    Patient Details  Name: Erica Camacho MRN: [id number] Date of Birth: 1951-07-08  Transition of Care [facility]) CM/SW Contact:    Allayne Butcher, RN Phone Number: 08/08/2020, 10:10 AM  Clinical Narrative:                 Patient admitted to the hospital with COVID currently requiring no supplemental oxygen.  RNCM was able to speak with patient via phone.  Patient reports that she is from home where she lives alone and is independent in ADL's and drives.  Patient does not have any equipment at home.  PT has recommended for patient to go to SNF patient prefers to go home with home health and reports that her daughter, Laurelyn Sickle will be able to stay with her and help her out once discharged.  Patient will need a walker and 3 in 1 at home and referral given to [employer], equipment will be delivered to the patient's room before discharge.   Patient has no preference in home health agency, Christy Gentles with [facility] has accepted referral for [facility] RN, PT, OT and aide. TOC team will cont to follow.   Expected Discharge Plan: Home w Home Health Services Barriers to Discharge: Continued Medical Work up   Patient Goals and CMS Choice Patient states their goals for this hospitalization and ongoing recovery are:: Wants to be stronger before going home CMS Medicare.gov Compare Post Acute Care list provided to:: Patient Choice offered to / list presented to : Patient  Expected Discharge Plan and Services Expected Discharge Plan: Home w Home Health Services   Discharge Planning Services: CM Consult Post Acute Care Choice: Home Health Living arrangements for the past 2 months: Single Family Home                 DME Arranged: 3-N-1, Walker rolling DME Agency: [facility]       HH Arranged: RN, PT, OT, Nurse's Aide HH Agency: [facility] Date HH Agency Contacted: 08/08/20 Time HH Agency Contacted: [phone] Representative spoke with at [facility] Agency:  Christy Gentles  Prior Living Arrangements/Services Living arrangements for the past 2 months: Single Family Home Lives with:: Self Patient language and need for interpreter reviewed:: Yes Do you feel safe going back to the place where you live?: Yes      Need for Family Participation in Patient Care: Yes (Comment) (COVID- weakness) Care giver support system in place?: Yes (comment) (daughter and pastor)   Criminal Activity/Legal Involvement Pertinent to Current Situation/Hospitalization: No - Comment as needed  Activities of Daily Living Home Assistive Devices/Equipment: None ADL Screening (condition at time of admission) Patient's cognitive ability adequate to safely complete daily activities?: Yes Is the patient deaf or have difficulty hearing?: No Does the patient have difficulty seeing, even when wearing glasses/contacts?: No Does the patient have difficulty concentrating, remembering, or making decisions?: No Patient able to express need for assistance with ADLs?: Yes Does the patient have difficulty dressing or bathing?: No Independently performs ADLs?: Yes (appropriate for developmental age) Does the patient have difficulty walking or climbing stairs?: Yes Weakness of Legs: None Weakness of Arms/Hands: None  Permission Sought/Granted Permission sought to share information with : Case Manager, Family Supports, Other (comment) Permission granted to share information with : Yes, Verbal Permission Granted  Share Information with NAME: Laurelyn Sickle  Permission granted to share info w AGENCY: Home Health agencies  Permission granted to share info w Relationship: daugher  Emotional Assessment   Attitude/Demeanor/Rapport: Engaged Affect (typically observed): Accepting Orientation: : Oriented to Self, Oriented to Place, Oriented to  Time, Oriented to Situation Alcohol / Substance Use: Not Applicable Psych Involvement: No (comment)  Admission diagnosis:  Hypoxia  [R09.02] Pneumonia due to COVID-19 virus [U07.1, J12.82] COVID-19 [U07.1] Patient Active Problem List   Diagnosis Date Noted  . Pneumonia due to COVID-19 virus 08/06/2020  . Hypoxia 08/06/2020  . Diabetes mellitus type 2 in obese (HCC) 08/06/2020  . Essential hypertension 08/06/2020  . COPD (chronic obstructive pulmonary disease) (HCC) 08/06/2020   PCP:  Grayland Jack, FNP Pharmacy:   [id number] [place], [place] - [address] AT [facility] [address] [place] [address] Phone: [phone] Fax: [phone]     Social Determinants of Health (SDOH) Interventions    Readmission Risk Interventions No flowsheet data found.

## 2020-08-08 NOTE — Progress Notes (Signed)
OT Cancellation Note  Patient Details Name: Erica Camacho MRN: [id number] DOB: 06-Jun-1951   Cancelled Treatment:    Reason Eval/Treat Not Completed: Patient not medically ready. Spoke with RN and chart reviewed. Last 3 blood glucose readings 422, 403, and 448. Pt contraindicated for participation in therapy at this time. Will re-attempt at later date/time as pt is medically appropriate.   Richrd Prime, MPH, MS, OTR/L ascom [phone] 08/08/20, 2:39 PM

## 2020-08-09 LAB — CBC WITH DIFFERENTIAL/PLATELET
Abs Immature Granulocytes: 0.88 10*3/uL — ABNORMAL HIGH (ref 0.00–0.07)
Basophils Absolute: 0.1 10*3/uL (ref 0.0–0.1)
Basophils Relative: 0 %
Eosinophils Absolute: 0 10*3/uL (ref 0.0–0.5)
Eosinophils Relative: 0 %
HCT: 37.3 % (ref 36.0–46.0)
Hemoglobin: 12.5 g/dL (ref 12.0–15.0)
Immature Granulocytes: 4 %
Lymphocytes Relative: 7 %
Lymphs Abs: 1.7 10*3/uL (ref 0.7–4.0)
MCH: 26.3 pg (ref 26.0–34.0)
MCHC: 33.5 g/dL (ref 30.0–36.0)
MCV: 78.5 fL — ABNORMAL LOW (ref 80.0–100.0)
Monocytes Absolute: 1.1 10*3/uL — ABNORMAL HIGH (ref 0.1–1.0)
Monocytes Relative: 5 %
Neutro Abs: 19.1 10*3/uL — ABNORMAL HIGH (ref 1.7–7.7)
Neutrophils Relative %: 84 %
Platelets: 501 10*3/uL — ABNORMAL HIGH (ref 150–400)
RBC: 4.75 MIL/uL (ref 3.87–5.11)
RDW: 15.1 % (ref 11.5–15.5)
WBC: 22.8 10*3/uL — ABNORMAL HIGH (ref 4.0–10.5)
nRBC: 0 % (ref 0.0–0.2)

## 2020-08-09 LAB — COMPREHENSIVE METABOLIC PANEL
ALT: 14 U/L (ref 0–44)
AST: 15 U/L (ref 15–41)
Albumin: 3.5 g/dL (ref 3.5–5.0)
Alkaline Phosphatase: 48 U/L (ref 38–126)
Anion gap: 10 (ref 5–15)
BUN: 38 mg/dL — ABNORMAL HIGH (ref 8–23)
CO2: 26 mmol/L (ref 22–32)
Calcium: 9.3 mg/dL (ref 8.9–10.3)
Chloride: 98 mmol/L (ref 98–111)
Creatinine, Ser: 0.87 mg/dL (ref 0.44–1.00)
GFR calc Af Amer: >60 mL/min (ref >60)
GFR calc non Af Amer: >60 mL/min (ref >60)
Glucose, Bld: 185 mg/dL — ABNORMAL HIGH (ref 70–99)
Potassium: 4.4 mmol/L (ref 3.5–5.1)
Sodium: 134 mmol/L — ABNORMAL LOW (ref 135–145)
Total Bilirubin: 0.8 mg/dL (ref 0.3–1.2)
Total Protein: 7.2 g/dL (ref 6.5–8.1)

## 2020-08-09 LAB — GLUCOSE, CAPILLARY
Glucose-Capillary: 162 mg/dL — ABNORMAL HIGH (ref 70–99)
Glucose-Capillary: 174 mg/dL — ABNORMAL HIGH (ref 70–99)
Glucose-Capillary: 329 mg/dL — ABNORMAL HIGH (ref 70–99)
Glucose-Capillary: 414 mg/dL — ABNORMAL HIGH (ref 70–99)
Glucose-Capillary: 316 mg/dL — ABNORMAL HIGH (ref 70–99)

## 2020-08-09 LAB — FIBRIN DERIVATIVES D-DIMER (ARMC ONLY): Fibrin derivatives D-dimer (ARMC): 397.61 ng/mL (FEU) (ref 0.00–499.00)

## 2020-08-09 LAB — FERRITIN: Ferritin: 197 ng/mL (ref 11–307)

## 2020-08-09 LAB — C-REACTIVE PROTEIN: CRP: 2.9 mg/dL — ABNORMAL HIGH (ref <1.0)

## 2020-08-09 MED ORDER — INSULIN DETEMIR 100 UNIT/ML ~~LOC~~ SOLN
14.0000 [IU] | Freq: Every day | SUBCUTANEOUS | Status: DC
  Filled 2020-08-09: qty 0.14

## 2020-08-09 MED ORDER — INSULIN ASPART 100 UNIT/ML ~~LOC~~ SOLN
3.0000 [IU] | Freq: Three times a day (TID) | SUBCUTANEOUS | Status: DC
  Administered 2020-08-09 (×2): 3 [IU] via SUBCUTANEOUS
  Filled 2020-08-09 (×3): qty 1

## 2020-08-09 MED ORDER — INSULIN ASPART 100 UNIT/ML ~~LOC~~ SOLN: 4.0000 [IU] | Freq: Three times a day (TID) | SUBCUTANEOUS | Status: DC

## 2020-08-09 MED ORDER — INSULIN ASPART 100 UNIT/ML ~~LOC~~ SOLN
10.0000 [IU] | Freq: Once | SUBCUTANEOUS | Status: AC
  Administered 2020-08-09: 10 [IU] via SUBCUTANEOUS

## 2020-08-09 NOTE — Progress Notes (Signed)
PROGRESS NOTE    Erica Camacho  JIR:[id number] DOB: Nov 14, 1951 DOA: 08/05/2020 PCP: Grayland Jack, FNP   Brief Narrative:  69 year old female with history significant for diabetes, hypertension, COPD not on home oxygen, hypothyroidism who presents with shortness of breath and cough.  Found to be Covid positive.  Started on Covid specific treatments including remdesivir, baricitinib, steroids.  At time my evaluation this morning is dependent on 3 to 4 L nasal cannula.  Saturating well.  Speaking in complete sentences.  Mentating clearly.  Normal work of breathing.  We will continue current scope of care for now.  9/9: Patient seen and examined.  Clinically appears improved.  Titrated to 1 to 2 L nasal cannula.  Continues to endorse chest pressure and cough.  9/10: Patient seen and examined.  Clinically the patient has been titrated off of nasal cannula.  She is mid 90s on room air at rest.  However she has rapid desaturation and extreme fatigue upon minimal exertion.  9/11: Patient seen and examined.  Clinically improved.  Remains on 1 to 2 L nasal .  Energy level much improved from yesterday.   Assessment & Plan:   Principal Problem:   Pneumonia due to COVID-19 virus Active Problems:   Hypoxia   Diabetes mellitus type 2 in obese [facility])   Essential hypertension   COPD (chronic obstructive pulmonary disease) (HCC)  Multifocal pneumonia secondary to COVID-19 Mild hypoxic respiratory failure secondary to above Patient initially required 4 L nasal cannula Oxygen requirement titrated off at room air Continues to require some oxygen upon ambulation Very rapid fatigue upon minimal exertion Inflammatory markers downtrending Plan: Continue remdesivir, 08/06/20-08/09/2020 Continue steroids, 08/06/2020- Continue baricitinib, 08/06/2020- Prone as tolerated Stressed I-S and flutter use No further Lasix today Therapy evaluations-recommend skilled nursing facility.  Patient declined.  Wants  to go home.  Home health ordered. -Tentative plan to discharge patient home on 08/10/2020 patient remains on room air, fever free, energy level improved.  Anticipate discharge home with home health.  Type 2 diabetes mellitus Marked hyperglycemia noted over interval Diabetes coordinator consult Plan: Levemir 12 units daily NovoLog 3 units times daily with meals 0-20 units SSI every 4 hours Nightly coverage Carb modified diet  Essential hypertension Continue home medications  Losartan Maxide  COPD Per patient mild COPD Uses albuterol nebulizer treatments at home as needed Does not appear acutely exacerbated Plan: Continue albuterol MDI as needed Supplemental oxygen   DVT prophylaxis: Lovenox Code Status: Full Family Communication: Left VM for daughter Laurelyn Sickle [phone] on 08/08/2020 Disposition Plan: Status is: Inpatient  Remains inpatient appropriate because:Inpatient level of care appropriate due to severity of illness   Dispo: The patient is from: Home              Anticipated d/c is to: Home              Anticipated d/c date is: 1 day              Patient currently is not medically stable to d/c.   Was titrated from nasal cannula yesterday, placed back on 4 mild hypoxia.  Will attempt to remove nasal cannula and titrate to room air today.  Patient's energy level also improved.  Anticipate 1 additional day of inpatient treatment and monitoring.  Anticipate discharge home with home health on 08/10/2020    Consultants:   None  Procedures:   None  Antimicrobials:   Remdesivir   Subjective: Seen and examined.  Fatigue improving.  Still  some cough  Objective: Vitals:   08/08/20 2033 08/08/20 2329 08/09/20 0321 08/09/20 0809  BP: (!) 118/51 121/67 128/77 120/60  Pulse: 67 71 89 69  Resp: 18 19 14 20   Temp: 97.7 F (36.5 C) 98.2 F (36.8 C) 97.7 F (36.5 C) 97.8 F (36.6 C)  TempSrc: Oral Oral Oral Oral  SpO2: 99% 96% 96% 99%  Weight:      Height:         Intake/Output Summary (Last 24 hours) at 08/09/2020 1003 Last data filed at 08/08/2020 1714 Gross per 24 hour  Intake 240 ml  Output --  Net 240 ml   Filed Weights   08/06/20 0850  Weight: 86.2 kg    Examination:  General: No acute distress.  Sitting up in bed.  Appears fatigued HEENT: Normocephalic, atraumatic Neck, supple, trachea midline, no tenderness Heart: Regular rate and rhythm, S1/S2 normal, no murmurs Lungs: Scattered bilateral crackles.  Normal work of breathing.  2 L Abdomen: Soft, nontender, nondistended, positive bowel sounds Extremities: Normal, atraumatic, no clubbing or cyanosis, normal muscle tone Skin: No rashes or lesions, normal color Neurologic: Cranial nerves grossly intact, sensation intact, alert and oriented x3 Psychiatric: Normal affect      Data Reviewed: I have personally reviewed following labs and imaging studies  CBC: Recent Labs  Lab 08/05/20 1400 08/07/20 0557 08/08/20 0556 08/09/20 0811  WBC 15.3* 22.8* 20.9* 22.8*  NEUTROABS 11.9* 18.8* 18.1* 19.1*  HGB 13.1 11.5* 11.9* 12.5  HCT 41.2 36.9 36.7 37.3  MCV 81.1 83.3 79.8* 78.5*  PLT 375 381 460* 501*   Basic Metabolic Panel: Recent Labs  Lab 08/05/20 1400 08/07/20 0557 08/08/20 0556 08/09/20 0811  NA 134* 138 132* 134*  K 4.4 4.9 4.9 4.4  CL 96* 101 97* 98  CO2 23 28 26 26   GLUCOSE 253* 257* 412* 185*  BUN 24* 25* 37* 38*  CREATININE 0.84 0.88 1.07* 0.87  CALCIUM 8.9 9.1 9.1 9.3   GFR: Estimated Creatinine Clearance: 62.6 mL/min (by C-G formula based on SCr of 0.87 mg/dL). Liver Function Tests: Recent Labs  Lab 08/05/20 1400 08/07/20 0557 08/08/20 0556 08/09/20 0811  AST 20 18 14* 15  ALT 12 13 13 14   ALKPHOS 52 47 50 48  BILITOT 1.0 0.8 0.8 0.8  PROT 7.7 7.2 7.2 7.2  ALBUMIN 3.7 3.2* 3.2* 3.5   No results for input(s): LIPASE, AMYLASE in the last 168 hours. No results for input(s): AMMONIA in the last 168 hours. Coagulation Profile: Recent Labs   Lab 08/05/20 2246  INR 1.1   Cardiac Enzymes: No results for input(s): CKTOTAL, CKMB, CKMBINDEX, TROPONINI in the last 168 hours. BNP (last 3 results) No results for input(s): PROBNP in the last 8760 hours. HbA1C: No results for input(s): HGBA1C in the last 72 hours. CBG: Recent Labs  Lab 08/08/20 1624 08/08/20 2035 08/08/20 2326 08/09/20 0319 08/09/20 0809  GLUCAP 340* 190* 150* 162* 174*   Lipid Profile: No results for input(s): CHOL, HDL, LDLCALC, TRIG, CHOLHDL, LDLDIRECT in the last 72 hours. Thyroid Function Tests: No results for input(s): TSH, T4TOTAL, FREET4, T3FREE, THYROIDAB in the last 72 hours. Anemia Panel: Recent Labs    08/08/20 0556 08/09/20 0811  FERRITIN 209 197   Sepsis Labs: Recent Labs  Lab 08/05/20 1400 08/05/20 2246  PROCALCITON  --  <0.10  LATICACIDVEN 1.7  --     Recent Results (from the past 240 hour(s))  SARS Coronavirus 2 by RT PCR (hospital order, performed in  [facility] hospital lab) Nasopharyngeal Nasopharyngeal Swab     Status: Abnormal   Collection Time: 08/05/20  9:38 PM   Specimen: Nasopharyngeal Swab  Result Value Ref Range Status   SARS Coronavirus 2 POSITIVE (A) NEGATIVE Final    Comment: RESULT CALLED TO, READ BACK BY AND VERIFIED WITH: Duncan Dull RN 0023 08/06/20 HNM (NOTE) SARS-CoV-2 target nucleic acids are DETECTED  SARS-CoV-2 RNA is generally detectable in upper respiratory specimens  during the acute phase of infection.  Positive results are indicative  of the presence of the identified virus, but do not rule out bacterial infection or co-infection with other pathogens not detected by the test.  Clinical correlation with patient history and  other diagnostic information is necessary to determine patient infection status.  The expected result is negative.  Fact Sheet for Patients:   BoilerBrush.com.cy   Fact Sheet for Healthcare Providers:   https://pope.com/     This test is not yet approved or cleared by the [place] FDA and  has been authorized for detection and/or diagnosis of SARS-CoV-2 by FDA under an Emergency Use Authorization (EUA).  This EUA will remain in effect (meaning this test  can be used) for the duration of  the COVID-19 declaration under Section 564(b)(1) of the Act, 21 U.S.C. section 360-bbb-3(b)(1), unless the authorization is terminated or revoked sooner.  Performed at [facility], [address] [place], [place] [address]   Blood culture (routine single)     Status: None (Preliminary result)   Collection Time: 08/05/20 10:46 PM   Specimen: BLOOD  Result Value Ref Range Status   Specimen Description BLOOD RT HAND  Final   Special Requests   Final    BOTTLES DRAWN AEROBIC AND ANAEROBIC Blood Culture adequate volume   Culture   Final    NO GROWTH 4 DAYS Performed at [facility], [address], [place], [place] [address]    Report Status PENDING  Incomplete  Urine culture     Status: Abnormal   Collection Time: 08/06/20  9:16 AM   Specimen: Urine, Random  Result Value Ref Range Status   Specimen Description   Final    URINE, RANDOM Performed at [facility], [address], [place], [place] [address]    Special Requests   Final    NONE Performed at [facility], [address] [place], [place] [address]    Culture (A)  Final    <10,000 COLONIES/mL INSIGNIFICANT GROWTH Performed at [facility], [address], [place], [place] [address]    Report Status 08/07/2020 FINAL  Final         Radiology Studies: No results found.      Scheduled Meds: . vitamin C  500 mg Oral Daily  . atorvastatin  40 mg Oral Daily  . baricitinib  4 mg Oral Daily  . enoxaparin (LOVENOX) injection  40 mg Subcutaneous Q24H  . insulin aspart  0-20 Units Subcutaneous Q4H  . insulin aspart  0-5 Units Subcutaneous QHS  . insulin aspart  3 Units Subcutaneous TID WC  .  insulin detemir  12 Units Subcutaneous Daily  . levothyroxine  150 mcg Oral QAC breakfast  . linagliptin  5 mg Oral Daily  . losartan  100 mg Oral Daily  . predniSONE  50 mg Oral Daily  . sodium chloride flush  10-40 mL Intracatheter Q12H  . triamterene-hydrochlorothiazide  1 tablet Oral Daily  . zinc sulfate  220 mg Oral  Daily   Continuous Infusions: . remdesivir 100 mg in NS 100 mL Stopped (08/08/20 1700)     LOS: 3 days    Time spent: 25 minutes    Tresa MooreSudheer B Legacie Dillingham, MD [facility] Pager [phone]  If 7PM-7AM, please contact night-coverage 08/09/2020, 10:03 AM

## 2020-08-10 LAB — CBC WITH DIFFERENTIAL/PLATELET
Abs Immature Granulocytes: 1.04 10*3/uL — ABNORMAL HIGH (ref 0.00–0.07)
Basophils Absolute: 0 10*3/uL (ref 0.0–0.1)
Basophils Relative: 0 %
Eosinophils Absolute: 0 10*3/uL (ref 0.0–0.5)
Eosinophils Relative: 0 %
HCT: 37.4 % (ref 36.0–46.0)
Hemoglobin: 12.2 g/dL (ref 12.0–15.0)
Immature Granulocytes: 5 %
Lymphocytes Relative: 9 %
Lymphs Abs: 2 10*3/uL (ref 0.7–4.0)
MCH: 26.6 pg (ref 26.0–34.0)
MCHC: 32.6 g/dL (ref 30.0–36.0)
MCV: 81.5 fL (ref 80.0–100.0)
Monocytes Absolute: 1.7 10*3/uL — ABNORMAL HIGH (ref 0.1–1.0)
Monocytes Relative: 8 %
Neutro Abs: 17.2 10*3/uL — ABNORMAL HIGH (ref 1.7–7.7)
Neutrophils Relative %: 78 %
Platelets: 465 10*3/uL — ABNORMAL HIGH (ref 150–400)
RBC: 4.59 MIL/uL (ref 3.87–5.11)
RDW: 15.4 % (ref 11.5–15.5)
WBC: 22 10*3/uL — ABNORMAL HIGH (ref 4.0–10.5)
nRBC: 0 % (ref 0.0–0.2)

## 2020-08-10 LAB — COMPREHENSIVE METABOLIC PANEL
ALT: 15 U/L (ref 0–44)
AST: 13 U/L — ABNORMAL LOW (ref 15–41)
Albumin: 3.3 g/dL — ABNORMAL LOW (ref 3.5–5.0)
Alkaline Phosphatase: 45 U/L (ref 38–126)
Anion gap: 9 (ref 5–15)
BUN: 41 mg/dL — ABNORMAL HIGH (ref 8–23)
CO2: 26 mmol/L (ref 22–32)
Calcium: 9.7 mg/dL (ref 8.9–10.3)
Chloride: 101 mmol/L (ref 98–111)
Creatinine, Ser: 0.92 mg/dL (ref 0.44–1.00)
GFR calc Af Amer: >60 mL/min (ref >60)
GFR calc non Af Amer: >60 mL/min (ref >60)
Glucose, Bld: 94 mg/dL (ref 70–99)
Potassium: 4.6 mmol/L (ref 3.5–5.1)
Sodium: 136 mmol/L (ref 135–145)
Total Bilirubin: 0.6 mg/dL (ref 0.3–1.2)
Total Protein: 7.2 g/dL (ref 6.5–8.1)

## 2020-08-10 LAB — C-REACTIVE PROTEIN: CRP: 1.6 mg/dL — ABNORMAL HIGH (ref <1.0)

## 2020-08-10 LAB — GLUCOSE, CAPILLARY
Glucose-Capillary: 116 mg/dL — ABNORMAL HIGH (ref 70–99)
Glucose-Capillary: 241 mg/dL — ABNORMAL HIGH (ref 70–99)
Glucose-Capillary: 273 mg/dL — ABNORMAL HIGH (ref 70–99)
Glucose-Capillary: 341 mg/dL — ABNORMAL HIGH (ref 70–99)
Glucose-Capillary: 72 mg/dL (ref 70–99)

## 2020-08-10 LAB — FERRITIN: Ferritin: 201 ng/mL (ref 11–307)

## 2020-08-10 LAB — CULTURE, BLOOD (SINGLE)
Culture: NO GROWTH
Special Requests: ADEQUATE

## 2020-08-10 LAB — FIBRIN DERIVATIVES D-DIMER (ARMC ONLY): Fibrin derivatives D-dimer (ARMC): 360.93 ng/mL (FEU) (ref 0.00–499.00)

## 2020-08-10 MED ORDER — INSULIN DETEMIR 100 UNIT/ML ~~LOC~~ SOLN
7.0000 [IU] | Freq: Every day | SUBCUTANEOUS | Status: DC
  Filled 2020-08-10: qty 0.07

## 2020-08-10 MED ORDER — ALBUTEROL SULFATE HFA 108 (90 BASE) MCG/ACT IN AERS: 2.0000 | INHALATION_SPRAY | Freq: Four times a day (QID) | RESPIRATORY_TRACT | 2 refills | Status: DC | PRN

## 2020-08-10 MED ORDER — BENZONATATE 100 MG PO CAPS: 100.0000 mg | ORAL_CAPSULE | Freq: Four times a day (QID) | ORAL | 0 refills | Status: AC | PRN

## 2020-08-10 MED ORDER — ASCORBIC ACID 500 MG PO TABS: 500.0000 mg | ORAL_TABLET | Freq: Every day | ORAL | 0 refills | Status: AC

## 2020-08-10 MED ORDER — INSULIN ASPART 100 UNIT/ML ~~LOC~~ SOLN
2.0000 [IU] | Freq: Three times a day (TID) | SUBCUTANEOUS | Status: DC
  Administered 2020-08-10 (×2): 2 [IU] via SUBCUTANEOUS
  Filled 2020-08-10 (×2): qty 1

## 2020-08-10 MED ORDER — PREDNISONE 50 MG PO TABS: 50.0000 mg | ORAL_TABLET | Freq: Every day | ORAL | 0 refills | Status: AC

## 2020-08-10 MED ORDER — ZINC SULFATE 220 (50 ZN) MG PO CAPS: 220.0000 mg | ORAL_CAPSULE | Freq: Every day | ORAL | 0 refills | Status: AC

## 2020-08-10 MED ORDER — INSULIN DETEMIR 100 UNIT/ML ~~LOC~~ SOLN
7.0000 [IU] | Freq: Every day | SUBCUTANEOUS | Status: DC
  Administered 2020-08-10: 7 [IU] via SUBCUTANEOUS
  Filled 2020-08-10 (×2): qty 0.07

## 2020-08-10 NOTE — Discharge Summary (Signed)
Physician Discharge Summary  HYDIE LANGAN UJW:[id number] DOB: [birth year] DOA: 08/05/2020  PCP: Grayland Jack, FNP  Admit date: 08/05/2020 Discharge date: 08/10/2020  Admitted From: Home Disposition: Home with home health  Recommendations for Outpatient Follow-up:  1. Follow up with PCP in 1-2 weeks 2.   Home Health:Yes, PT, OT, aide Equipment/Devices:Oxygen 2L via [place]  Discharge Condition:Stable CODE STATUS:Full Diet recommendation: Heart Healthy / Carb Modified Brief/Interim Summary: 69 year old female with history significant for diabetes, hypertension, COPD not on home oxygen, hypothyroidism who presents with shortness of breath and cough. Found to be Covid positive. Started on Covid specific treatments including remdesivir, baricitinib, steroids. At time my evaluation this morning is dependent on 3 to 4 L nasal cannula. Saturating well. Speaking in complete sentences. Mentating clearly. Normal work of breathing. We will continue current scope of care for now.  9/9: Patient seen and examined.  Clinically appears improved.  Titrated to 1 to 2 L nasal cannula.  Continues to endorse chest pressure and cough.  9/10: Patient seen and examined.  Clinically the patient has been titrated off of nasal cannula.  She is mid 90s on room air at rest.  However she has rapid desaturation and extreme fatigue upon minimal exertion.  9/11: Patient seen and examined.  Clinically improved.  Remains on 1 to 2 L nasal.  Energy level much improved from yesterday.  9/12: Patient seen and examined the day of discharge.  Stable, no distress.  Energy level improved.  Stable on room air but requires 2 L of oxygen when ambulating.  Home oxygen ordered.  Home health ordered.  Called and updated daughter Laurelyn Sickle via phone.  Discharge Diagnoses:  Principal Problem:   Pneumonia due to COVID-19 virus Active Problems:   Hypoxia   Diabetes mellitus type 2 in obese [facility])   Essential hypertension    COPD (chronic obstructive pulmonary disease) (HCC)  Multifocal pneumonia secondary to COVID-19 Mild hypoxic respiratory failure secondary to above Patient initially required 4 L nasal cannula Oxygen requirement titrated off at room air Was able to ambulate around nursing unit Did desaturate to 87% Home oxygen ordered Remdesivir completed in house Steroids to be continued on discharge, complete 10-day course Post discharge Covid instructions provided to patient at bedside and daughter via phone Home health ordered  Type 2 diabetes mellitus Hyperglycemia in house secondary to steroid administration.  Can continue home regimen on discharge  Essential hypertension Continue home medications  Losartan Maxide  COPD Per patient mild COPD Uses albuterol nebulizer treatments at home as needed Does not appear acutely exacerbated Plan: Continue albuterol MDI as needed Supplemental oxygen  Discharge Instructions   Allergies as of 08/10/2020      Reactions   Aspirin Hives, Other (See Comments)   Allergic to aspirin that is not coated. Hives and GI upset   Ampicillin Hives   Carrot [daucus Carota] Hives   Codeine Hives   Latex Itching   Penicillins Hives, Other (See Comments)   Has patient had a PCN reaction causing immediate rash, facial/tongue/throat swelling, SOB or lightheadedness with hypotension: No Has patient had a PCN reaction causing severe rash involving mucus membranes or skin necrosis: Yes Has patient had a PCN reaction that required hospitalization: Yes Has patient had a PCN reaction occurring within the last 10 years: No If all of the above answers are "NO", then may proceed with Cephalosporin use.      Medication List    TAKE these medications   acetaminophen 500 MG tablet Commonly  known as: TYLENOL Take 500 mg by mouth daily as needed for moderate pain or headache.   albuterol 108 (90 Base) MCG/ACT inhaler Commonly known as: VENTOLIN HFA Inhale 2 puffs  into the lungs every 6 (six) hours as needed for wheezing or shortness of breath. What changed: Another medication with the same name was added. Make sure you understand how and when to take each.   albuterol 108 (90 Base) MCG/ACT inhaler Commonly known as: VENTOLIN HFA Inhale 2 puffs into the lungs every 6 (six) hours as needed for wheezing or shortness of breath. What changed: You were already taking a medication with the same name, and this prescription was added. Make sure you understand how and when to take each.   albuterol 108 (90 Base) MCG/ACT inhaler Commonly known as: VENTOLIN HFA Inhale 2 puffs into the lungs every 6 (six) hours as needed for wheezing or shortness of breath. What changed: You were already taking a medication with the same name, and this prescription was added. Make sure you understand how and when to take each.   amLODipine 10 MG tablet Commonly known as: NORVASC Take 10 mg by mouth daily.   Anoro Ellipta 62.5-25 MCG/INH Aepb Generic drug: umeclidinium-vilanterol Inhale 1 puff into the lungs daily.   ascorbic acid 500 MG tablet Commonly known as: VITAMIN C Take 1 tablet (500 mg total) by mouth daily. Start taking on: August 11, 2020   aspirin EC 81 MG tablet Take 81 mg by mouth daily.   atorvastatin 40 MG tablet Commonly known as: LIPITOR Take 40 mg by mouth daily.   benzonatate 200 MG capsule Commonly known as: TESSALON Take 200 mg by mouth 3 (three) times daily as needed. What changed: Another medication with the same name was added. Make sure you understand how and when to take each.   benzonatate 100 MG capsule Commonly known as: Tessalon Perles Take 1 capsule (100 mg total) by mouth every 6 (six) hours as needed for cough. What changed: You were already taking a medication with the same name, and this prescription was added. Make sure you understand how and when to take each.   Bevespi Aerosphere 9-4.8 MCG/ACT Aero Generic drug:  Glycopyrrolate-Formoterol Inhale 1 puff into the lungs as directed.   Emergen-C Immune Pack Take 1 packet by mouth daily as needed (immune support).   glimepiride 4 MG tablet Commonly known as: AMARYL Take 4 mg by mouth daily.   levothyroxine 150 MCG tablet Commonly known as: SYNTHROID Take 150 mcg by mouth daily.   losartan 100 MG tablet Commonly known as: COZAAR Take 100 mg by mouth daily.   meclizine 25 MG tablet Commonly known as: ANTIVERT Take 25 mg by mouth daily.   metFORMIN 500 MG tablet Commonly known as: GLUCOPHAGE Take 500 mg by mouth in the morning and at bedtime.   Multi-Vitamin tablet Take 1 tablet by mouth every other day.   ondansetron 4 MG tablet Commonly known as: ZOFRAN Take 4 mg by mouth every 8 (eight) hours as needed.   pioglitazone 30 MG tablet Commonly known as: ACTOS Take 30 mg by mouth daily.   predniSONE 50 MG tablet Commonly known as: DELTASONE Take 1 tablet (50 mg total) by mouth daily with breakfast for 6 days.   promethazine 25 MG tablet Commonly known as: PHENERGAN Take 25 mg by mouth every 8 (eight) hours as needed.   sitaGLIPtin 100 MG tablet Commonly known as: JANUVIA Take 100 mg by mouth daily.   triamterene-hydrochlorothiazide  37.5-25 MG tablet Commonly known as: MAXZIDE-25 Take 1 tablet by mouth daily.   VISINE OP Place 1 drop into both eyes daily as needed (dry eyes).   Vitamin D3 1.25 MG (50000 UT) Caps Take 1 capsule by mouth every 7 (seven) days.   zinc sulfate 220 (50 Zn) MG capsule Take 1 capsule (220 mg total) by mouth daily. Start taking on: August 11, 2020            Durable Medical Equipment  (From admission, onward)         Start     Ordered   08/10/20 1251  For home use only DME oxygen  Once       Question Answer Comment  Length of Need 6 Months   Mode or (Route) Nasal cannula   Liters per Minute 2   Frequency Continuous (stationary and portable oxygen unit needed)   Oxygen conserving  device Yes   Oxygen delivery system Gas      08/10/20 1251   08/08/20 0958  For home use only DME Walker rolling  Once       Question Answer Comment  Walker: With 5 Inch Wheels   Patient needs a walker to treat with the following condition COVID-19      08/08/20 0957   08/08/20 0958  For home use only DME 3 n 1  Once        08/08/20 0957          Follow-up Information    Grayland Jack, FNP. Schedule an appointment as soon as possible for a visit in 1 week(s).   Specialty: Family Medicine Contact information: [address] [place] [address] [phone]              Allergies  Allergen Reactions  . Aspirin Hives and Other (See Comments)    Allergic to aspirin that is not coated. Hives and GI upset  . Ampicillin Hives  . Carrot [Daucus Carota] Hives  . Codeine Hives  . Latex Itching  . Penicillins Hives and Other (See Comments)    Has patient had a PCN reaction causing immediate rash, facial/tongue/throat swelling, SOB or lightheadedness with hypotension: No Has patient had a PCN reaction causing severe rash involving mucus membranes or skin necrosis: Yes Has patient had a PCN reaction that required hospitalization: Yes Has patient had a PCN reaction occurring within the last 10 years: No If all of the above answers are "NO", then may proceed with Cephalosporin use.     Consultations:  None   Procedures/Studies: DG Chest 2 View  Result Date: 08/05/2020 CLINICAL DATA:  Shortness of breath. EXAM: CHEST - 2 VIEW COMPARISON:  09/25/2018. FINDINGS: Mediastinum hilar structures normal. Heart size normal. Diffuse prominent bilateral pulmonary interstitial infiltrates consistent with pneumonitis noted. Question patient's COVID status. No pleural effusion or pneumothorax. IMPRESSION: Diffuse prominent bilateral pulmonary interstitial infiltrates consistent pneumonitis noted. Question patient's COVID status. Electronically Signed   By: Maisie Fus  Register   On:  08/05/2020 14:24   (Echo, Carotid, EGD, Colonoscopy, ERCP)    Subjective: Patient seen and examined on day of discharge.  Stable, no distress.  Weaned off submental oxygen.  Continues to require with ambulation.  Stable for discharge home at this time.  Daughter updated on discharge plan via phone. Discharge Exam: Vitals:   08/10/20 1204 08/10/20 1225  BP: (!) 119/53   Pulse: 73   Resp: 14   Temp: 98 F (36.7 C)   SpO2:  91%  Vitals:   08/10/20 0844 08/10/20 0847 08/10/20 1204 08/10/20 1225  BP: (!) 126/55 117/61 (!) 119/53   Pulse:  72 73   Resp:   14   Temp:   98 F (36.7 C)   TempSrc:   Oral   SpO2:    91%  Weight:      Height:        General: Pt is alert, awake, not in acute distress Cardiovascular: RRR, S1/S2 +, no rubs, no gallops Respiratory: CTA bilaterally, no wheezing, no rhonchi Abdominal: Soft, NT, ND, bowel sounds + Extremities: no edema, no cyanosis    The results of significant diagnostics from this hospitalization (including imaging, microbiology, ancillary and laboratory) are listed below for reference.     Microbiology: Recent Results (from the past 240 hour(s))  SARS Coronavirus 2 by RT PCR (hospital order, performed in [facility] hospital lab) Nasopharyngeal Nasopharyngeal Swab     Status: Abnormal   Collection Time: 08/05/20  9:38 PM   Specimen: Nasopharyngeal Swab  Result Value Ref Range Status   SARS Coronavirus 2 POSITIVE (A) NEGATIVE Final    Comment: RESULT CALLED TO, READ BACK BY AND VERIFIED WITH: Duncan Dull RN 0023 08/06/20 HNM (NOTE) SARS-CoV-2 target nucleic acids are DETECTED  SARS-CoV-2 RNA is generally detectable in upper respiratory specimens  during the acute phase of infection.  Positive results are indicative  of the presence of the identified virus, but do not rule out bacterial infection or co-infection with other pathogens not detected by the test.  Clinical correlation with patient history and  other diagnostic  information is necessary to determine patient infection status.  The expected result is negative.  Fact Sheet for Patients:   BoilerBrush.com.cy   Fact Sheet for Healthcare Providers:   https://pope.com/    This test is not yet approved or cleared by the [place] FDA and  has been authorized for detection and/or diagnosis of SARS-CoV-2 by FDA under an Emergency Use Authorization (EUA).  This EUA will remain in effect (meaning this test  can be used) for the duration of  the COVID-19 declaration under Section 564(b)(1) of the Act, 21 U.S.C. section 360-bbb-3(b)(1), unless the authorization is terminated or revoked sooner.  Performed at [facility], [address] [place], [place] [address]   Blood culture (routine single)     Status: None   Collection Time: 08/05/20 10:46 PM   Specimen: BLOOD  Result Value Ref Range Status   Specimen Description BLOOD RT HAND  Final   Special Requests   Final    BOTTLES DRAWN AEROBIC AND ANAEROBIC Blood Culture adequate volume   Culture   Final    NO GROWTH 5 DAYS Performed at [facility], [address], [place], [place] [address]    Report Status 08/10/2020 FINAL  Final  Urine culture     Status: Abnormal   Collection Time: 08/06/20  9:16 AM   Specimen: Urine, Random  Result Value Ref Range Status   Specimen Description   Final    URINE, RANDOM Performed at [facility], [address], [place], [place] [address]    Special Requests   Final    NONE Performed at [facility], [address], [place], [place] [address]    Culture (A)  Final    <10,000 COLONIES/mL INSIGNIFICANT GROWTH Performed at [facility], [address] [place], [place] [address]    Report Status 08/07/2020 FINAL  Final  Labs: BNP (last 3 results) Recent Labs    08/06/20 0044  BNP 18.8   Basic Metabolic Panel: Recent Labs  Lab 08/05/20 1400  08/07/20 0557 08/08/20 0556 08/09/20 0811 08/10/20 0501  NA 134* 138 132* 134* 136  K 4.4 4.9 4.9 4.4 4.6  CL 96* 101 97* 98 101  CO2 GLUCOSE 253* 257* 412* 185* 94  BUN 24* 25* 37* 38* 41*  CREATININE 0.84 0.88 1.07* 0.87 0.92  CALCIUM 8.9 9.1 9.1 9.3 9.7   Liver Function Tests: Recent Labs  Lab 08/05/20 1400 08/07/20 0557 08/08/20 0556 08/09/20 0811 08/10/20 0501  AST 20 18 14* 15 13*  ALT ALKPHOS 52 47 50 48 45  BILITOT 1.0 0.8 0.8 0.8 0.6  PROT 7.7 7.2 7.2 7.2 7.2  ALBUMIN 3.7 3.2* 3.2* 3.5 3.3*   No results for input(s): LIPASE, AMYLASE in the last 168 hours. No results for input(s): AMMONIA in the last 168 hours. CBC: Recent Labs  Lab 08/05/20 1400 08/07/20 0557 08/08/20 0556 08/09/20 0811 08/10/20 0501  WBC 15.3* 22.8* 20.9* 22.8* 22.0*  NEUTROABS 11.9* 18.8* 18.1* 19.1* 17.2*  HGB 13.1 11.5* 11.9* 12.5 12.2  HCT 41.2 36.9 36.7 37.3 37.4  MCV 81.1 83.3 79.8* 78.5* 81.5  PLT 375 381 460* 501* 465*   Cardiac Enzymes: No results for input(s): CKTOTAL, CKMB, CKMBINDEX, TROPONINI in the last 168 hours. BNP: Invalid input(s): POCBNP CBG: Recent Labs  Lab 08/09/20 2001 08/10/20 0023 08/10/20 0443 08/10/20 0742 08/10/20 1201  GLUCAP 316* 241* 116* 72 273*   D-Dimer No results for input(s): DDIMER in the last 72 hours. Hgb A1c No results for input(s): HGBA1C in the last 72 hours. Lipid Profile No results for input(s): CHOL, HDL, LDLCALC, TRIG, CHOLHDL, LDLDIRECT in the last 72 hours. Thyroid function studies No results for input(s): TSH, T4TOTAL, T3FREE, THYROIDAB in the last 72 hours.  Invalid input(s): FREET3 Anemia work up Recent Labs    08/09/20 0811 08/10/20 0501  FERRITIN 197 201   Urinalysis    Component Value Date/Time   COLORURINE YELLOW (A) 08/06/2020 0916   APPEARANCEUR HAZY (A) 08/06/2020 0916   LABSPEC 1.016 08/06/2020 0916   PHURINE 5.0 08/06/2020 0916   GLUCOSEU >=500 (A) 08/06/2020 0916    HGBUR NEGATIVE 08/06/2020 0916   BILIRUBINUR NEGATIVE 08/06/2020 0916   KETONESUR 5 (A) 08/06/2020 0916   PROTEINUR NEGATIVE 08/06/2020 0916   NITRITE NEGATIVE 08/06/2020 0916   LEUKOCYTESUR NEGATIVE 08/06/2020 0916   Sepsis Labs Invalid input(s): PROCALCITONIN,  WBC,  LACTICIDVEN Microbiology Recent Results (from the past 240 hour(s))  SARS Coronavirus 2 by RT PCR (hospital order, performed in [facility] hospital lab) Nasopharyngeal Nasopharyngeal Swab     Status: Abnormal   Collection Time: 08/05/20  9:38 PM   Specimen: Nasopharyngeal Swab  Result Value Ref Range Status   SARS Coronavirus 2 POSITIVE (A) NEGATIVE Final    Comment: RESULT CALLED TO, READ BACK BY AND VERIFIED WITH: Duncan Dull RN 0023 08/06/20 HNM (NOTE) SARS-CoV-2 target nucleic acids are DETECTED  SARS-CoV-2 RNA is generally detectable in upper respiratory specimens  during the acute phase of infection.  Positive results are indicative  of the presence of the identified virus, but do not rule out bacterial infection or co-infection with other pathogens not detected by the test.  Clinical correlation with patient history and  other diagnostic information is necessary to determine patient infection status.  The expected result is  negative.  Fact Sheet for Patients:   BoilerBrush.com.cyhttps://www.fda.gov/media/136312/download   Fact Sheet for Healthcare Providers:   https://pope.com/https://www.fda.gov/media/136313/download    This test is not yet approved or cleared by the [place] FDA and  has been authorized for detection and/or diagnosis of SARS-CoV-2 by FDA under an Emergency Use Authorization (EUA).  This EUA will remain in effect (meaning this test  can be used) for the duration of  the COVID-19 declaration under Section 564(b)(1) of the Act, 21 U.S.C. section 360-bbb-3(b)(1), unless the authorization is terminated or revoked sooner.  Performed at [facility], [address] [place], [place] [address]   Blood  culture (routine single)     Status: None   Collection Time: 08/05/20 10:46 PM   Specimen: BLOOD  Result Value Ref Range Status   Specimen Description BLOOD RT HAND  Final   Special Requests   Final    BOTTLES DRAWN AEROBIC AND ANAEROBIC Blood Culture adequate volume   Culture   Final    NO GROWTH 5 DAYS Performed at [facility], [address] [place], [place] [address]    Report Status 08/10/2020 FINAL  Final  Urine culture     Status: Abnormal   Collection Time: 08/06/20  9:16 AM   Specimen: Urine, Random  Result Value Ref Range Status   Specimen Description   Final    URINE, RANDOM Performed at [facility], [address] [place], [place] [address]    Special Requests   Final    NONE Performed at [facility], [address] [place], [place] [address]    Culture (A)  Final    <10,000 COLONIES/mL INSIGNIFICANT GROWTH Performed at [facility], [address] [place], [place] [address]    Report Status 08/07/2020 FINAL  Final     Time coordinating discharge: Over 30 minutes  SIGNED:   Tresa MooreSudheer B Carmello Cabiness, MD  [facility] 08/10/2020, 1:17 PM Pager   If 7PM-7AM, please contact night-coverage

## 2020-08-10 NOTE — Progress Notes (Signed)
SATURATION QUALIFICATIONS: (This note is used to comply with regulatory documentation for home oxygen)  Patient Saturations on Room Air at Rest = 92%  Patient Saturations on Room Air while Ambulating = 87%  Patient Saturations on 2 Liters of oxygen while Ambulating = 93-94%  Please briefly explain why patient needs home oxygen:

## 2020-08-10 NOTE — TOC Transition Note (Signed)
Transition of Care [facility]) - CM/SW Discharge Note   Patient Details  Name: Erica Camacho MRN: [id number] Date of Birth: December 26, 1950  Transition of Care [facility] CM/SW Contact:  Allayne Butcher, RN Phone Number: 08/10/2020, 2:08 PM   Clinical Narrative:    Patient has been medically cleared for discharge home with home health services.  Patient needs home O2 and home health services.  Morrie Sheldon with [facility] [facility] has accepted referral for home O2 and will deliver here to the room and to the home today.  Dan Humphreys was provided by [employer] and delivered this past Friday.  Patient's daughter will be picking her up today.    Final next level of care: Home w Home Health Services Barriers to Discharge: Barriers Resolved   Patient Goals and CMS Choice Patient states their goals for this hospitalization and ongoing recovery are:: Wants to be stronger before going home CMS Medicare.gov Compare Post Acute Care list provided to:: Patient Choice offered to / list presented to : Patient  Discharge Placement                       Discharge Plan and Services   Discharge Planning Services: CM Consult Post Acute Care Choice: Home Health          DME Arranged: Oxygen DME Agency: [facility] Date DME Agency Contacted: 08/10/20 Time DME Agency Contacted: 1300 Representative spoke with at DME Agency: [facility] HH Arranged: RN, PT, OT, Nurse's Aide HH Agency: [facility] Date [facility] Agency Contacted: 08/10/20 Time HH Agency Contacted: [phone] Representative spoke with at [facility] Agency: Christy Gentles  Social Determinants of Health (SDOH) Interventions     Readmission Risk Interventions No flowsheet data found.

## 2020-08-10 NOTE — Discharge Instructions (Signed)
COVID-19 COVID-19 is a respiratory infection that is caused by a virus called severe acute respiratory syndrome coronavirus 2 (SARS-CoV-2). The disease is also known as coronavirus disease or novel coronavirus. In some people, the virus may not cause any symptoms. In others, it may cause a serious infection. The infection can get worse quickly and can lead to complications, such as:  Pneumonia, or infection of the lungs.  Acute respiratory distress syndrome or ARDS. This is a condition in which fluid build-up in the lungs prevents the lungs from filling with air and passing oxygen into the blood.  Acute respiratory failure. This is a condition in which there is not enough oxygen passing from the lungs to the body or when carbon dioxide is not passing from the lungs out of the body.  Sepsis or septic shock. This is a serious bodily reaction to an infection.  Blood clotting problems.  Secondary infections due to bacteria or fungus.  Organ failure. This is when your body's organs stop working. The virus that causes COVID-19 is contagious. This means that it can spread from person to person through droplets from coughs and sneezes (respiratory secretions). What are the causes? This illness is caused by a virus. You may catch the virus by:  Breathing in droplets from an infected person. Droplets can be spread by a person breathing, speaking, singing, coughing, or sneezing.  Touching something, like a table or a doorknob, that was exposed to the virus (contaminated) and then touching your mouth, nose, or eyes. What increases the risk? Risk for infection You are more likely to be infected with this virus if you:  Are within 6 feet (2 meters) of a person with COVID-19.  Provide care for or live with a person who is infected with COVID-19.  Spend time in crowded indoor spaces or live in shared housing. Risk for serious illness You are more likely to become seriously ill from the virus if  you:  Are 51 years of age or older. The higher your age, the more you are at risk for serious illness.  Live in a nursing home or long-term care facility.  Have cancer.  Have a long-term (chronic) disease such as: ? Chronic lung disease, including chronic obstructive pulmonary disease or asthma. ? A long-term disease that lowers your body's ability to fight infection (immunocompromised). ? Heart disease, including heart failure, a condition in which the arteries that lead to the heart become narrow or blocked (coronary artery disease), a disease which makes the heart muscle thick, weak, or stiff (cardiomyopathy). ? Diabetes. ? Chronic kidney disease. ? Sickle cell disease, a condition in which red blood cells have an abnormal "sickle" shape. ? Liver disease.  Are obese. What are the signs or symptoms? Symptoms of this condition can range from mild to severe. Symptoms may appear any time from 2 to 14 days after being exposed to the virus. They include:  A fever or chills.  A cough.  Difficulty breathing.  Headaches, body aches, or muscle aches.  Runny or stuffy (congested) nose.  A sore throat.  New loss of taste or smell. Some people may also have stomach problems, such as nausea, vomiting, or diarrhea. Other people may not have any symptoms of COVID-19. How is this diagnosed? This condition may be diagnosed based on:  Your signs and symptoms, especially if: ? You live in an area with a COVID-19 outbreak. ? You recently traveled to or from an area where the virus is common. ? You  provide care for or live with a person who was diagnosed with COVID-19. ? You were exposed to a person who was diagnosed with COVID-19.  A physical exam.  Lab tests, which may include: ? Taking a sample of fluid from the back of your nose and throat (nasopharyngeal fluid), your nose, or your throat using a swab. ? A sample of mucus from your lungs (sputum). ? Blood tests.  Imaging tests,  which may include, X-rays, CT scan, or ultrasound. How is this treated? At present, there is no medicine to treat COVID-19. Medicines that treat other diseases are being used on a trial basis to see if they are effective against COVID-19. Your health care provider will talk with you about ways to treat your symptoms. For most people, the infection is mild and can be managed at home with rest, fluids, and over-the-counter medicines. Treatment for a serious infection usually takes places in a hospital intensive care unit (ICU). It may include one or more of the following treatments. These treatments are given until your symptoms improve.  Receiving fluids and medicines through an IV.  Supplemental oxygen. Extra oxygen is given through a tube in the nose, a face mask, or a hood.  Positioning you to lie on your stomach (prone position). This makes it easier for oxygen to get into the lungs.  Continuous positive airway pressure (CPAP) or bi-level positive airway pressure (BPAP) machine. This treatment uses mild air pressure to keep the airways open. A tube that is connected to a motor delivers oxygen to the body.  Ventilator. This treatment moves air into and out of the lungs by using a tube that is placed in your windpipe.  Tracheostomy. This is a procedure to create a hole in the neck so that a breathing tube can be inserted.  Extracorporeal membrane oxygenation (ECMO). This procedure gives the lungs a chance to recover by taking over the functions of the heart and lungs. It supplies oxygen to the body and removes carbon dioxide. Follow these instructions at home: Lifestyle  If you are sick, stay home except to get medical care. Your health care provider will tell you how long to stay home. Call your health care provider before you go for medical care.  Rest at home as told by your health care provider.  Do not use any products that contain nicotine or tobacco, such as cigarettes,  e-cigarettes, and chewing tobacco. If you need help quitting, ask your health care provider.  Return to your normal activities as told by your health care provider. Ask your health care provider what activities are safe for you. General instructions  Take over-the-counter and prescription medicines only as told by your health care provider.  Drink enough fluid to keep your urine pale yellow.  Keep all follow-up visits as told by your health care provider. This is important. How is this prevented?  There is no vaccine to help prevent COVID-19 infection. However, there are steps you can take to protect yourself and others from this virus. To protect yourself:   Do not travel to areas where COVID-19 is a risk. The areas where COVID-19 is reported change often. To identify high-risk areas and travel restrictions, check the CDC travel website: FatFares.com.br  If you live in, or must travel to, an area where COVID-19 is a risk, take precautions to avoid infection. ? Stay away from people who are sick. ? Wash your hands often with soap and water for 20 seconds. If soap and water  are not available, use an alcohol-based hand sanitizer. ? Avoid touching your mouth, face, eyes, or nose. ? Avoid going out in public, follow guidance from your state and local health authorities. ? If you must go out in public, wear a cloth face covering or face mask. Make sure your mask covers your nose and mouth. ? Avoid crowded indoor spaces. Stay at least 6 feet (2 meters) away from others. ? Disinfect objects and surfaces that are frequently touched every day. This may include:  Counters and tables.  Doorknobs and light switches.  Sinks and faucets.  Electronics, such as phones, remote controls, keyboards, computers, and tablets. To protect others: If you have symptoms of COVID-19, take steps to prevent the virus from spreading to others.  If you think you have a COVID-19 infection, contact  your health care provider right away. Tell your health care team that you think you may have a COVID-19 infection.  Stay home. Leave your house only to seek medical care. Do not use public transport.  Do not travel while you are sick.  Wash your hands often with soap and water for 20 seconds. If soap and water are not available, use alcohol-based hand sanitizer.  Stay away from other members of your household. Let healthy household members care for children and pets, if possible. If you have to care for children or pets, wash your hands often and wear a mask. If possible, stay in your own room, separate from others. Use a different bathroom.  Make sure that all people in your household wash their hands well and often.  Cough or sneeze into a tissue or your sleeve or elbow. Do not cough or sneeze into your hand or into the air.  Wear a cloth face covering or face mask. Make sure your mask covers your nose and mouth. Where to find more information  Centers for Disease Control and Prevention: PurpleGadgets.be  World Health Organization: https://www.castaneda.info/ Contact a health care provider if:  You live in or have traveled to an area where COVID-19 is a risk and you have symptoms of the infection.  You have had contact with someone who has COVID-19 and you have symptoms of the infection. Get help right away if:  You have trouble breathing.  You have pain or pressure in your chest.  You have confusion.  You have bluish lips and fingernails.  You have difficulty waking from sleep.  You have symptoms that get worse. These symptoms may represent a serious problem that is an emergency. Do not wait to see if the symptoms will go away. Get medical help right away. Call your local emergency services (911 in the [place] Do not drive yourself to the hospital. Let the emergency medical personnel know if you think you have  COVID-19. Summary  COVID-19 is a respiratory infection that is caused by a virus. It is also known as coronavirus disease or novel coronavirus. It can cause serious infections, such as pneumonia, acute respiratory distress syndrome, acute respiratory failure, or sepsis.  The virus that causes COVID-19 is contagious. This means that it can spread from person to person through droplets from breathing, speaking, singing, coughing, or sneezing.  You are more likely to develop a serious illness if you are 70 years of age or older, have a weak immune system, live in a nursing home, or have chronic disease.  There is no medicine to treat COVID-19. Your health care provider will talk with you about ways to treat your symptoms.  Take steps to protect yourself and others from infection. Wash your hands often and disinfect objects and surfaces that are frequently touched every day. Stay away from people who are sick and wear a mask if you are sick. This information is not intended to replace advice given to you by your health care provider. Make sure you discuss any questions you have with your health care provider. Document Revised: 09/14/2019 Document Reviewed: 12/21/2018 Elsevier Patient Education  2020 [employer].  COVID-19: How to Protect Yourself and Others Know how it spreads  There is currently no vaccine to prevent coronavirus disease 2019 (COVID-19).  The best way to prevent illness is to avoid being exposed to this virus.  The virus is thought to spread mainly from person-to-person. ? Between people who are in close contact with one another (within about 6 feet). ? Through respiratory droplets produced when an infected person coughs, sneezes or talks. ? These droplets can land in the mouths or noses of people who are nearby or possibly be inhaled into the lungs. ? COVID-19 may be spread by people who are not showing symptoms. Everyone should Clean your hands often  Wash your hands  often with soap and water for at least 20 seconds especially after you have been in a public place, or after blowing your nose, coughing, or sneezing.  If soap and water are not readily available, use a hand sanitizer that contains at least 60% alcohol. Cover all surfaces of your hands and rub them together until they feel dry.  Avoid touching your eyes, nose, and mouth with unwashed hands. Avoid close contact  Limit contact with others as much as possible.  Avoid close contact with people who are sick.  Put distance between yourself and other people. ? Remember that some people without symptoms may be able to spread virus. ? This is especially important for people who are at higher risk of getting very GainPain.com.cy Cover your mouth and nose with a mask when around others  You could spread COVID-19 to others even if you do not feel sick.  Everyone should wear a mask in public settings and when around people not living in their household, especially when social distancing is difficult to maintain. ? Masks should not be placed on young children under age 2, anyone who has trouble breathing, or is unconscious, incapacitated or otherwise unable to remove the mask without assistance.  The mask is meant to protect other people in case you are infected.  Do NOT use a facemask meant for a Dietitian.  Continue to keep about 6 feet between yourself and others. The mask is not a substitute for social distancing. Cover coughs and sneezes  Always cover your mouth and nose with a tissue when you cough or sneeze or use the inside of your elbow.  Throw used tissues in the trash.  Immediately wash your hands with soap and water for at least 20 seconds. If soap and water are not readily available, clean your hands with a hand sanitizer that contains at least 60% alcohol. Clean and disinfect  Clean AND disinfect  frequently touched surfaces daily. This includes tables, doorknobs, light switches, countertops, handles, desks, phones, keyboards, toilets, faucets, and sinks. RackRewards.fr  If surfaces are dirty, clean them: Use detergent or soap and water prior to disinfection.  Then, use a household disinfectant. You can see a list of EPA-registered household disinfectants here. michellinders.com 08/01/2019 This information is not intended to replace advice given to you by  your health care provider. Make sure you discuss any questions you have with your health care provider. Document Revised: 08/09/2019 Document Reviewed: 06/07/2019 Elsevier Patient Education  2020 [employer].  COVID-19: Quarantine vs. Isolation QUARANTINE keeps someone who was in close contact with someone who has COVID-19 away from others. If you had close contact with a person who has COVID-19  Stay home until 14 days after your last contact.  Check your temperature twice a day and watch for symptoms of COVID-19.  If possible, stay away from people who are at higher-risk for getting very sick from COVID-19. ISOLATION keeps someone who is sick or tested positive for COVID-19 without symptoms away from others, even in their own home. If you are sick and think or know you have COVID-19  Stay home until after ? At least 10 days since symptoms first appeared and ? At least 24 hours with no fever without fever-reducing medication and ? Symptoms have improved If you tested positive for COVID-19 but do not have symptoms  Stay home until after ? 10 days have passed since your positive test If you live with others, stay in a specific "sick room" or area and away from other people or animals, including pets. Use a separate bathroom, if available. SouthAmericaFlowers.co.uk 06/18/2019 This information is not intended to replace advice given to you by your health care  provider. Make sure you discuss any questions you have with your health care provider. Document Revised: 11/01/2019 Document Reviewed: 11/01/2019 Elsevier Patient Education  2020 [employer].

## 2021-04-21 ENCOUNTER — Other Ambulatory Visit: Payer: Self-pay

## 2021-04-21 DIAGNOSIS — Z1211 Encounter for screening for malignant neoplasm of colon: Secondary | ICD-10-CM

## 2021-04-22 ENCOUNTER — Other Ambulatory Visit: Payer: Self-pay

## 2021-04-22 ENCOUNTER — Telehealth: Payer: Self-pay

## 2021-04-22 MED ORDER — SUPREP BOWEL PREP KIT 17.5-3.13-1.6 GM/177ML PO SOLN: 1.0000 | ORAL | 0 refills | Status: DC

## 2021-04-22 NOTE — Telephone Encounter (Signed)
Gastroenterology Pre-Procedure Review  Request Date: 05/15/21 Requesting Physician: Dr. Allen Norris  PATIENT REVIEW QUESTIONS: The patient responded to the following health history questions as indicated:    1. Are you having any GI issues? no 2. Do you have a personal history of Polyps? no 3. Do you have a family history of Colon Cancer or Polyps? yes (Sister) 4. Diabetes Mellitus? yes (Type 2) 5. Joint replacements in the past 12 months?no 6. Major health problems in the past 3 months?no 7. Any artificial heart valves, MVP, or defibrillator?no    MEDICATIONS & ALLERGIES:    Patient reports the following regarding taking any anticoagulation/antiplatelet therapy:   Plavix, Coumadin, Eliquis, Xarelto, Lovenox, Pradaxa, Brilinta, or Effient? no Aspirin? yes (ASA 28m)  Patient confirms/reports the following medications:  Current Outpatient Medications  Medication Sig Dispense Refill  . acetaminophen (TYLENOL) 500 MG tablet Take 500 mg by mouth daily as needed for moderate pain or headache.    . albuterol (PROVENTIL HFA;VENTOLIN HFA) 108 (90 Base) MCG/ACT inhaler Inhale 2 puffs into the lungs every 6 (six) hours as needed for wheezing or shortness of breath. 1 Inhaler 0  . albuterol (VENTOLIN HFA) 108 (90 Base) MCG/ACT inhaler Inhale 2 puffs into the lungs every 6 (six) hours as needed for wheezing or shortness of breath. 8 g 2  . albuterol (VENTOLIN HFA) 108 (90 Base) MCG/ACT inhaler Inhale 2 puffs into the lungs every 6 (six) hours as needed for wheezing or shortness of breath. 8 g 2  . amLODipine (NORVASC) 10 MG tablet Take 10 mg by mouth daily.    .Marland Kitchenaspirin EC 81 MG tablet Take 81 mg by mouth daily.    .Marland Kitchenatorvastatin (LIPITOR) 40 MG tablet Take 40 mg by mouth daily.    . benzonatate (TESSALON PERLES) 100 MG capsule Take 1 capsule (100 mg total) by mouth every 6 (six) hours as needed for cough. 30 capsule 0  . benzonatate (TESSALON) 200 MG capsule Take 200 mg by mouth 3 (three) times daily as  needed.    . Cholecalciferol (VITAMIN D3) 1.25 MG (50000 UT) CAPS Take 1 capsule by mouth every 7 (seven) days.     .Marland Kitchenglimepiride (AMARYL) 4 MG tablet Take 4 mg by mouth daily.    . Glycopyrrolate-Formoterol (BEVESPI AEROSPHERE) 9-4.8 MCG/ACT AERO Inhale 1 puff into the lungs as directed.    .Marland Kitchenlevothyroxine (SYNTHROID) 150 MCG tablet Take 150 mcg by mouth daily.    .Marland Kitchenlosartan (COZAAR) 100 MG tablet Take 100 mg by mouth daily.    . meclizine (ANTIVERT) 25 MG tablet Take 25 mg by mouth daily.    . metFORMIN (GLUCOPHAGE) 500 MG tablet Take 500 mg by mouth in the morning and at bedtime.    . Multiple Vitamin (MULTI-VITAMIN) tablet Take 1 tablet by mouth every other day.    . Multiple Vitamins-Minerals (EMERGEN-C IMMUNE) PACK Take 1 packet by mouth daily as needed (immune support).    . Na Sulfate-K Sulfate-Mg Sulf (SUPREP BOWEL PREP KIT) 17.5-3.13-1.6 GM/177ML SOLN Take 1 kit by mouth as directed. 354 mL 0  . ondansetron (ZOFRAN) 4 MG tablet Take 4 mg by mouth every 8 (eight) hours as needed.    . pioglitazone (ACTOS) 30 MG tablet Take 30 mg by mouth daily.    . promethazine (PHENERGAN) 25 MG tablet Take 25 mg by mouth every 8 (eight) hours as needed.    . sitaGLIPtin (JANUVIA) 100 MG tablet Take 100 mg by mouth daily.    .Marland Kitchen  Tetrahydrozoline HCl (VISINE OP) Place 1 drop into both eyes daily as needed (dry eyes).    . triamterene-hydrochlorothiazide (MAXZIDE-25) 37.5-25 MG tablet Take 1 tablet by mouth daily.  3  . umeclidinium-vilanterol (ANORO ELLIPTA) 62.5-25 MCG/INH AEPB Inhale 1 puff into the lungs daily.     No current facility-administered medications for this visit.    Patient confirms/reports the following allergies:  Allergies  Allergen Reactions  . Aspirin Hives and Other (See Comments)    Allergic to aspirin that is not coated. Hives and GI upset  . Ampicillin Hives  . Carrot [Daucus Carota] Hives  . Codeine Hives  . Latex Itching  . Penicillins Hives and Other (See Comments)     Has patient had a PCN reaction causing immediate rash, facial/tongue/throat swelling, SOB or lightheadedness with hypotension: No Has patient had a PCN reaction causing severe rash involving mucus membranes or skin necrosis: Yes Has patient had a PCN reaction that required hospitalization: Yes Has patient had a PCN reaction occurring within the last 10 years: No If all of the above answers are "NO", then may proceed with Cephalosporin use.     No orders of the defined types were placed in this encounter.   AUTHORIZATION INFORMATION Primary Insurance: 1D#: Group #:  Secondary Insurance: 1D#: Group #:  SCHEDULE INFORMATION: Date:  Time: Location:

## 2021-05-15 ENCOUNTER — Ambulatory Visit: Admission: RE | Admit: 2021-05-15 | Payer: Medicare PPO | Source: Home / Self Care | Admitting: Gastroenterology

## 2021-05-15 ENCOUNTER — Encounter: Admission: RE | Payer: Self-pay | Source: Home / Self Care

## 2021-05-15 SURGERY — COLONOSCOPY WITH PROPOFOL
Anesthesia: Choice

## 2022-04-22 ENCOUNTER — Emergency Department: Payer: Medicare PPO

## 2022-04-22 ENCOUNTER — Emergency Department
Admission: EM | Admit: 2022-04-22 | Discharge: 2022-04-22 | Disposition: A | Discharge status: Home / Self Care | Payer: Medicare PPO | Attending: Emergency Medicine | Admitting: Emergency Medicine

## 2022-04-22 ENCOUNTER — Other Ambulatory Visit: Payer: Self-pay

## 2022-04-22 DIAGNOSIS — R42 Dizziness and giddiness: Secondary | ICD-10-CM | POA: Insufficient documentation

## 2022-04-22 DIAGNOSIS — R531 Weakness: Secondary | ICD-10-CM | POA: Diagnosis not present

## 2022-04-22 DIAGNOSIS — Z20822 Contact with and (suspected) exposure to covid-19: Secondary | ICD-10-CM | POA: Insufficient documentation

## 2022-04-22 DIAGNOSIS — R0602 Shortness of breath: Secondary | ICD-10-CM | POA: Diagnosis not present

## 2022-04-22 DIAGNOSIS — J449 Chronic obstructive pulmonary disease, unspecified: Secondary | ICD-10-CM | POA: Diagnosis not present

## 2022-04-22 DIAGNOSIS — R55 Syncope and collapse: Secondary | ICD-10-CM | POA: Diagnosis not present

## 2022-04-22 LAB — CBC
HCT: 44.1 % (ref 36.0–46.0)
Hemoglobin: 13.8 g/dL (ref 12.0–15.0)
MCH: 27.1 pg (ref 26.0–34.0)
MCHC: 31.3 g/dL (ref 30.0–36.0)
MCV: 86.5 fL (ref 80.0–100.0)
Platelets: 280 10*3/uL (ref 150–400)
RBC: 5.1 MIL/uL (ref 3.87–5.11)
RDW: 14.5 % (ref 11.5–15.5)
WBC: 13.9 10*3/uL — ABNORMAL HIGH (ref 4.0–10.5)
nRBC: 0 % (ref 0.0–0.2)

## 2022-04-22 LAB — BASIC METABOLIC PANEL
Anion gap: 11 (ref 5–15)
BUN: 15 mg/dL (ref 8–23)
CO2: 26 mmol/L (ref 22–32)
Calcium: 9.4 mg/dL (ref 8.9–10.3)
Chloride: 99 mmol/L (ref 98–111)
Creatinine, Ser: 0.88 mg/dL (ref 0.44–1.00)
GFR, Estimated: >60 mL/min (ref >60)
Glucose, Bld: 136 mg/dL — ABNORMAL HIGH (ref 70–99)
Potassium: 4.1 mmol/L (ref 3.5–5.1)
Sodium: 136 mmol/L (ref 135–145)

## 2022-04-22 LAB — HEPATIC FUNCTION PANEL
ALT: 27 U/L (ref 0–44)
AST: 19 U/L (ref 15–41)
Albumin: 3.6 g/dL (ref 3.5–5.0)
Alkaline Phosphatase: 76 U/L (ref 38–126)
Bilirubin, Direct: 0.1 mg/dL (ref 0.0–0.2)
Indirect Bilirubin: 0.8 mg/dL (ref 0.3–0.9)
Total Bilirubin: 0.9 mg/dL (ref 0.3–1.2)
Total Protein: 6.5 g/dL (ref 6.5–8.1)

## 2022-04-22 LAB — TSH: TSH: 2.993 u[IU]/mL (ref 0.350–4.500)

## 2022-04-22 LAB — TROPONIN I (HIGH SENSITIVITY)
Troponin I (High Sensitivity): 7 ng/L (ref <18)
Troponin I (High Sensitivity): 6 ng/L (ref <18)

## 2022-04-22 LAB — RESP PANEL BY RT-PCR (FLU A&B, COVID) ARPGX2
Influenza A by PCR: NEGATIVE
Influenza B by PCR: NEGATIVE
SARS Coronavirus 2 by RT PCR: NEGATIVE

## 2022-04-22 LAB — BRAIN NATRIURETIC PEPTIDE: B Natriuretic Peptide: 21.4 pg/mL (ref 0.0–100.0)

## 2022-04-22 LAB — T4, FREE: Free T4: 1.33 ng/dL — ABNORMAL HIGH (ref 0.61–1.12)

## 2022-04-22 MED ORDER — MECLIZINE HCL 25 MG PO TABS
25.0000 mg | ORAL_TABLET | Freq: Once | ORAL | Status: AC
  Administered 2022-04-22: 25 mg via ORAL
  Filled 2022-04-22: qty 1

## 2022-04-22 MED ORDER — IOHEXOL 350 MG/ML SOLN
75.0000 mL | Freq: Once | INTRAVENOUS | Status: AC | PRN
  Administered 2022-04-22: 75 mL via INTRAVENOUS

## 2022-04-22 MED ORDER — IPRATROPIUM-ALBUTEROL 0.5-2.5 (3) MG/3ML IN SOLN
3.0000 mL | Freq: Once | RESPIRATORY_TRACT | Status: AC
  Administered 2022-04-22: 3 mL via RESPIRATORY_TRACT
  Filled 2022-04-22: qty 3

## 2022-04-22 MED ORDER — MECLIZINE HCL 25 MG PO TABS: 25.0000 mg | ORAL_TABLET | Freq: Four times a day (QID) | ORAL | 1 refills | Status: DC | PRN

## 2022-04-22 MED ORDER — FAMOTIDINE 20 MG PO TABS: 20.0000 mg | ORAL_TABLET | Freq: Two times a day (BID) | ORAL | 0 refills | Status: DC

## 2022-04-22 NOTE — ED Notes (Signed)
2 unsuccessful attempts made by this RN for IV access. Will have another staff member assess for access.

## 2022-04-22 NOTE — ED Provider Notes (Signed)
[facility] Provider Note    Event Date/Time   First MD Initiated Contact with Patient 04/22/22 1256     (approximate)   History   Shortness of Breath and Dizziness   HPI  Erica Camacho is a 71 y.o. female with COPD who comes in with concerns for shortness of breath.  On review of records patient was seen on 5/17 at [facility] and was treated with steroids and Zithromax and inhaler for COPD exacerbation.  She then went to the [facility] and they sent her to the ER to be further evaluated.  Patient reports having 3 weeks of increasing shortness of breath and dizziness.  She reports that she feels the room is spinning and that she is going to pass out.  She reports some increasing generalized weakness and difficulties ambulating secondary to this.  She denies anything making it better and it is worse when she tries to stand up.  She denies any chest pain or history of blood clots.  She reports that she feels like it is a lot harder to lay flat at night due to the shortness of breath.  She also reports a little bit of a the pole and causing some scratchiness of her throat and having a little bit of difficulty swallowing but still able to tolerate p.o. and liquids.  No neck mass felt.  Physical Exam   Triage Vital Signs: ED Triage Vitals  Enc Vitals Group     BP 04/22/22 1225 (!) 149/92     Pulse Rate 04/22/22 1225 75     Resp 04/22/22 1225 16     Temp 04/22/22 1225 98.5 F (36.9 C)     Temp Source 04/22/22 1225 Oral     SpO2 04/22/22 1225 95 %     Weight 04/22/22 1226 190 lb (86.2 kg)     Height 04/22/22 1226 5\' 1"  (1.549 m)     Head Circumference --      Peak Flow --      Pain Score --      Pain Loc --      Pain Edu? --      Excl. in GC? --     Most recent vital signs: Vitals:   04/22/22 1225  BP: (!) 149/92  Pulse: 75  Resp: 16  Temp: 98.5 F (36.9 C)  SpO2: 95%     General: Awake, no distress.  CV:  Good peripheral perfusion.   Resp:  Normal effort.  No wheezing noted Abd:  No distention.  Soft and nontender Other:  Patient does get short of breath with ambulation but her oxygen level stay 99% and she is got no ataxia. Cranial nerves II through XII are intact.  Equal strength in arms and legs.  Sensation is intact.  Finger-to-nose intact bilaterally Full range of motion of neck without any neck masses.  Oral pharynx evaluated without any evidence of tonsillar swelling.  Uvula is midline. ED Results / Procedures / Treatments   Labs (all labs ordered are listed, but only abnormal results are displayed) Labs Reviewed  CBC - Abnormal; Notable for the following components:      Result Value   WBC 13.9 (*)    All other components within normal limits  RESP PANEL BY RT-PCR (FLU A&B, COVID) ARPGX2  BASIC METABOLIC PANEL  HEPATIC FUNCTION PANEL  T4, FREE  TSH  BRAIN NATRIURETIC PEPTIDE  TROPONIN I (HIGH SENSITIVITY)     EKG  My interpretation  of EKG:  Normal sinus rate of 73 without any ST elevation or T wave inversions, normal intervals  RADIOLOGY I have reviewed the xray personally and interpreted and there is no evidence of any pneumonia  PROCEDURES:  Critical Care performed: No  .1-3 Lead EKG Interpretation Performed by: Concha Se, MD Authorized by: Concha Se, MD     Interpretation: normal     ECG rate:  70   ECG rate assessment: normal     Rhythm: sinus rhythm     Ectopy: none     Conduction: normal     MEDICATIONS ORDERED IN ED: Medications  meclizine (ANTIVERT) tablet 25 mg (25 mg Oral Given 04/22/22 1342)     IMPRESSION / MDM / ASSESSMENT AND PLAN / ED COURSE  I reviewed the triage vital signs and the nursing notes.   Patient comes in with increasing dizziness and shortness of breath with near syncopal episodes.  Will get orthostatics, labs to evaluate for any other acute life-threatening pathology such as ACS, Electra abnormalities, AKI, UTI.  Will get CT head evaluate for  any intercranial hemorrhage.  Given x-ray is negative we will also get CT PE to evaluate for any lung pathology that could be contributing to symptoms given do not hear any wheezing on examination at this time.  For patient's difficulty swallowing do not see anything on examination and I discussed that she needs to follow-up with endoscopy and GI   CBC showed elevated white count but downtrending from prior.  BMP is reassuring.  Thyroid reassuring.  Patient be handed off to oncoming team pending these results.  Orthostatic blood pressures are negative   The patient is on the cardiac monitor to evaluate for evidence of arrhythmia and/or significant heart rate changes.      FINAL CLINICAL IMPRESSION(S) / ED DIAGNOSES   Final diagnoses:  Dizziness     Rx / DC Orders   ED Discharge Orders     None        Note:  This document was prepared using Dragon voice recognition software and may include unintentional dictation errors.   Concha Se, MD 04/22/22 [phone]

## 2022-04-22 NOTE — ED Provider Notes (Signed)
Procedures     ----------------------------------------- 8:11 PM on 04/22/2022 ----------------------------------------- Patient reports improvement of her symptoms with meclizine.  CT scan of the head was viewed and interpreted by me, appears normal.  Radiology report reviewed.  CT angiogram chest negative for PE or other acute findings.  Due to patient's ongoing dizziness, though improved, I obtained an MRI brain which was negative for stroke.  I discussed the findings with radiology who noted absence of flow-voids in the venous sinus on one side.  The patient is not having any headache, vision changes, seizures, confusion, aphasia, clinically not consistent with venous sinus thrombosis, so I think this is an incidental finding of slow flow.  Discussed all the results with the patient, she is comfortable following up with primary care and continuing with referral to gastroenterology for her GERD symptoms.  I did look in her throat and I do not see any thrush but I do see diffuse oropharyngeal erythema, dull, so I will start her on twice daily Pepcid to see if this helps with her symptoms, particularly the feeling of something stuck in her throat that is worse when lying down at night.  I doubt ACS, dissection, arrhythmia, vascular dissection, cerebral venous thrombosis, meningitis, encephalitis, glaucoma.  She is not requiring admission due to the extensive reassuring work-up and absence of focal neurologic deficits.    Sharman Cheek, MD 04/22/22 2013

## 2022-04-22 NOTE — ED Notes (Signed)
Lab contacted to send phlebotomist. Two staff members unsuccessful at finding vein to draw blood

## 2022-04-22 NOTE — ED Triage Notes (Signed)
Pt c/o SOB with dizziness and generalized weakness over the past week, sent from [facility], was seen at [facility] a week ago and dx with COPD exacerbation.

## 2022-06-08 NOTE — Progress Notes (Unsigned)
Cardiology Office Note  Date:  06/09/2022   ID:  VERLENE GLANTZ, DOB 1951-03-31, MRN [id number]  PCP:  System, Provider Not In   Chief Complaint  Patient presents with   New Patient (Initial Visit)    Referred by ED for dizziness -- patient reports they found out in the ED that she has vertigo. Patient does not have any dizziness at this time. Patient reports SOB.  Meds reviewed verbally with patient.     HPI:  Ms. Taylore Hinde is a 71 year old woman with past medical history of Hypoxia/COPD/asthma Coronary calcification on CT diabetes Recent evaluation in the emergency room Apr 22, 2022 for dizziness Who presents for new patient evaluation of her dizziness  Reports recent episode COPD exacerbation treated with steroids, Zithromax in May 2023 Seen in the emergency room May 2023 for dizziness/vertigo Work-up in the ER including CT angio chest, head, no acute findings Felt the room was spinning and she was going to pass out Reported having some weakness  In follow-up today, reports her dizziness has improved  CT scan chest Apr 22, 2022 images pulled up and reviewed Moderate coronary calciufactionm on CT scan  She does report some shortness of breath and tightness in the chest on exertion  Not taking lipitor regularly A1C 11 in 2021 , up from 7  No recent lab work available, reports she is getting established with new primary care  EKG personally reviewed by myself on todays visit Normal sinus rhythm rate 86 bpm no significant ST-T wave changes  PMH:   has a past medical history of COPD (chronic obstructive pulmonary disease) ([place]), Diabetes mellitus without complication ([place]), Dyspnea, Heart murmur, Hyperlipidemia, Hypertension, Hypothyroidism, Thyroid disease, Vertigo, and Wheezing.  PSH:    Past Surgical History:  Procedure Laterality Date   CATARACT EXTRACTION W/PHACO Left 10/05/2018   Procedure: CATARACT EXTRACTION PHACO AND INTRAOCULAR LENS PLACEMENT (IOC);  Surgeon:  Marchia Meiers, MD;  Location: [facility] ORS;  Service: Ophthalmology;  Laterality: Left;  [place] 00:46.5 CDE 8.29 Fluid Pack Lot # [id number]   CESAREAN SECTION     X 3   FRACTURE SURGERY      Current Outpatient Medications  Medication Sig Dispense Refill   acetaminophen (TYLENOL) 500 MG tablet Take 500 mg by mouth daily as needed for moderate pain or headache.     albuterol (PROVENTIL HFA;VENTOLIN HFA) 108 (90 Base) MCG/ACT inhaler Inhale 2 puffs into the lungs every 6 (six) hours as needed for wheezing or shortness of breath. 1 Inhaler 0   amLODipine (NORVASC) 10 MG tablet Take 10 mg by mouth daily.     aspirin EC 81 MG tablet Take 81 mg by mouth daily.     atorvastatin (LIPITOR) 40 MG tablet Take 40 mg by mouth daily.     Cholecalciferol (VITAMIN D3) 1.25 MG (50000 UT) CAPS Take 1 capsule by mouth every 7 (seven) days.      glimepiride (AMARYL) 4 MG tablet Take 4 mg by mouth daily.     ivabradine (CORLANOR) 7.5 MG TABS tablet Take 2 tablets (15 mg total) by mouth once for 1 dose. Take 2 hours prior to your CT scan 2 tablet 0   levothyroxine (SYNTHROID) 150 MCG tablet Take 150 mcg by mouth daily.     losartan (COZAAR) 100 MG tablet Take 100 mg by mouth daily.     meclizine (ANTIVERT) 25 MG tablet Take 1 tablet (25 mg total) by mouth every 6 (six) hours as needed for dizziness or nausea. [place]  tablet 1   metFORMIN (GLUCOPHAGE) 500 MG tablet Take 500 mg by mouth in the morning and at bedtime.     metoprolol tartrate (LOPRESSOR) 100 MG tablet Take 1 tablet (100 mg total) by mouth once for 1 dose. Take two hours prior to your CT. 1 tablet 0   Multiple Vitamin (MULTI-VITAMIN) tablet Take 1 tablet by mouth every other day.     Multiple Vitamins-Minerals (EMERGEN-C IMMUNE) PACK Take 1 packet by mouth daily as needed (immune support).     Na Sulfate-K Sulfate-Mg Sulf (SUPREP BOWEL PREP KIT) 17.5-3.13-1.6 GM/177ML SOLN Take 1 kit by mouth as directed. 354 mL 0   pioglitazone (ACTOS) 30 MG tablet Take 30 mg by  mouth daily.     Tetrahydrozoline HCl (VISINE OP) Place 1 drop into both eyes daily as needed (dry eyes).     triamterene-hydrochlorothiazide (MAXZIDE-25) 37.5-25 MG tablet Take 1 tablet by mouth daily.  3   TRULICITY 1.5 QJ/1.9ER SOPN Inject into the skin.     famotidine (PEPCID) 20 MG tablet Take 1 tablet (20 mg total) by mouth 2 (two) times daily. 60 tablet 0   No current facility-administered medications for this visit.     Allergies:   Aspirin, Ampicillin, Carrot [daucus carota], Codeine, Latex, and Penicillins   Social History:  The patient  reports that she has quit smoking. She has never used smokeless tobacco. She reports that she does not drink alcohol and does not use drugs.   Family History:   family history is not on file.    Review of Systems: Review of Systems  Constitutional: Negative.   HENT: Negative.    Eyes:  Negative for double vision.  Respiratory:  Positive for shortness of breath.   Cardiovascular:  Positive for chest pain.  Gastrointestinal: Negative.   Musculoskeletal: Negative.   Neurological: Negative.   Psychiatric/Behavioral: Negative.    All other systems reviewed and are negative.   PHYSICAL EXAM: VS:  BP (!) 141/79 (BP Location: Left Arm, Patient Position: Sitting, Cuff Size: Normal)   Pulse 86   Ht $R'5\' 1"'oy$  (1.549 m)   Wt 196 lb (88.9 kg)   SpO2 97%   BMI 37.03 kg/m  , BMI Body mass index is 37.03 kg/m. GEN: Well nourished, well developed, in no acute distress HEENT: normal Neck: no JVD, carotid bruits, or masses Cardiac: RRR; no murmurs, rubs, or gallops,no edema  Respiratory:  clear to auscultation bilaterally, normal work of breathing GI: soft, nontender, nondistended, + BS MS: no deformity or atrophy Skin: warm and dry, no rash Neuro:  Strength and sensation are intact Psych: euthymic mood, full affect   Recent Labs: 04/22/2022: ALT 27; B Natriuretic Peptide 21.4; BUN 15; Creatinine, Ser 0.88; Hemoglobin 13.8; Platelets 280;  Potassium 4.1; Sodium 136; TSH 2.993    Lipid Panel No results found for: "CHOL", "HDL", "LDLCALC", "TRIG"    Wt Readings from Last 3 Encounters:  06/09/22 196 lb (88.9 kg)  04/22/22 190 lb (86.2 kg)  08/06/20 190 lb (86.2 kg)       ASSESSMENT AND PLAN:  Problem List Items Addressed This Visit       Cardiology Problems   Essential hypertension - Primary   Relevant Medications   metoprolol tartrate (LOPRESSOR) 100 MG tablet   ivabradine (CORLANOR) 7.5 MG TABS tablet   Other Relevant Orders   EKG [id number]   Basic Metabolic Panel (BMET)   Other Visit Diagnoses     Coronary artery disease, unspecified vessel or lesion type,  unspecified whether angina present, unspecified whether native or transplanted heart       Relevant Medications   metoprolol tartrate (LOPRESSOR) 100 MG tablet   ivabradine (CORLANOR) 7.5 MG TABS tablet   Other Relevant Orders   CT CORONARY MORPH W/CTA COR W/SCORE W/CA W/CM &/OR WO/CM   Angina pectoris (HCC)       Relevant Medications   metoprolol tartrate (LOPRESSOR) 100 MG tablet   ivabradine (CORLANOR) 7.5 MG TABS tablet   Other Relevant Orders   CT CORONARY MORPH W/CTA COR W/SCORE W/CA W/CM &/OR WO/CM   Shortness of breath       Dizziness          Dizziness Appears this was the predominant reason for her visit today on referral from the emergency room She reports that she was told she has vertigo and symptoms have since resolved Per her account, less likely cardiac arrhythmia as a contributor to symptoms.  Symptoms described consistent with benign positional vertigo May 2023  Angina/coronary calcification Reports having episodes of shortness of breath on exertion, some tightness Unable to exclude underlying ischemia Risk factors include prior smoking history, hyperlipidemia, poorly controlled diabetes Heavy coronary calcification noted on CT chest May 2023 Recommended further evaluation with cardiac CTA She is in agreement, order placed,  instructions provided  Hyperlipidemia Some noncompliance with her Lipitor, recommended that she take this daily Goal LDL less than 70  Type 2 diabetes with complications Stressed the importance of taking her diabetes medications, close follow-up with primary care No recent A1c available, she reports that she has close follow-up with new primary care physician Most recent A1c on record was 11, numbers discussed with her in detail    Total encounter time more than 50 minutes  Greater than 50% was spent in counseling and coordination of care with the patient    Signed, Esmond Plants, M.D., Ph.D. [place], [place]

## 2022-06-09 ENCOUNTER — Encounter: Payer: Self-pay | Admitting: Cardiovascular Disease

## 2022-06-09 ENCOUNTER — Ambulatory Visit (INDEPENDENT_AMBULATORY_CARE_PROVIDER_SITE_OTHER): Payer: Medicare PPO | Admitting: Cardiovascular Disease

## 2022-06-09 VITALS — BP 141/79 | HR 86 | Ht 61.0 in | Wt 196.0 lb

## 2022-06-09 DIAGNOSIS — I2510 Atherosclerotic heart disease of native coronary artery without angina pectoris: Secondary | ICD-10-CM

## 2022-06-09 DIAGNOSIS — R42 Dizziness and giddiness: Secondary | ICD-10-CM

## 2022-06-09 DIAGNOSIS — R0602 Shortness of breath: Secondary | ICD-10-CM

## 2022-06-09 DIAGNOSIS — I10 Essential (primary) hypertension: Secondary | ICD-10-CM

## 2022-06-09 DIAGNOSIS — I209 Angina pectoris, unspecified: Secondary | ICD-10-CM | POA: Diagnosis not present

## 2022-06-09 MED ORDER — IVABRADINE HCL 7.5 MG PO TABS: 15.0000 mg | ORAL_TABLET | Freq: Once | ORAL | 0 refills | Status: AC

## 2022-06-09 MED ORDER — METOPROLOL TARTRATE 100 MG PO TABS: 100.0000 mg | ORAL_TABLET | Freq: Once | ORAL | 0 refills | Status: DC

## 2022-06-09 NOTE — Patient Instructions (Addendum)
Goal total chol <150 Goal LDL <70   Medication Instructions:  No changes Please take atorvastatin daily  If you need a refill on your cardiac medications before your next appointment, please call your pharmacy.   Lab work: Today: BMP  [facility] Entrance at [facility] 1st desk on the right to check in (REGISTRATION)  Lab hours: Monday- Friday (7:30 am- 5:30 pm)   Testing/Procedures:  Your physician has requested that you have cardiac CT. Cardiac computed tomography (CT) is a painless test that uses an x-ray machine to take clear, detailed pictures of your heart.    Your cardiac CT has been scheduled for Thursday, July 20th at 11 am at:  [facility] [address] [place], [place] [address] [phone]  Please arrive 15 mins early for check-in and test prep.  Please follow these instructions carefully (unless otherwise directed):  On the Night Before the Test: Be sure to Drink plenty of water. Do not consume any caffeinated/decaffeinated beverages or chocolate 12 hours prior to your test.  On the Day of the Test: Drink plenty of water until 1 hour prior to the test. Do not eat any food 4 hours prior to the test. You may take your regular medications prior to the test.  Take metoprolol (Lopressor) and ivabradine (Corlanor) two hours prior to test. These have been sent into your pharmacy. Use GoodRX coupon for [facility] [employer] and pay cash price.  FEMALES- please wear underwire-free bra if available, avoid dresses & tight clothing       After the Test: Drink plenty of water. After receiving IV contrast, you may experience a mild flushed feeling. This is normal. On occasion, you may experience a mild rash up to 24 hours after the test. This is not dangerous. If this occurs, you can take Benadryl 25 mg and increase your fluid intake. If you experience trouble breathing, this can be serious. If it is severe call 911 IMMEDIATELY. If it is  mild, please call our office. If you take any of these medications: Glipizide/Metformin, Avandament, Glucavance, please do not take 48 hours after completing test unless otherwise instructed.  Please allow 2-4 weeks for scheduling of routine cardiac CTs. Some insurance companies require a pre-authorization which may delay scheduling of this test.   For non-scheduling related questions, please contact the cardiac imaging nurse navigator should you have any questions/concerns: Erica Camacho, Cardiac Imaging Nurse Navigator Erica Camacho, Cardiac Imaging Nurse Navigator [facility] Direct Office Dial: [phone]   For scheduling needs, including cancellations and rescheduling, please call Erica Camacho, [phone].    Follow-Up: At [facility], you and your health needs are our priority.  As part of our continuing mission to provide you with exceptional heart care, we have created designated Provider Care Teams.  These Care Teams include your primary Cardiologist (physician) and Advanced Practice Providers (APPs -  Physician Assistants and Nurse Practitioners) who all work together to provide you with the care you need, when you need it.  You will need a follow up appointment in 12 months  Providers on your designated Care Team:   Erica Ducking, NP Erica Listen, PA-C Erica Camacho, [place]  COVID-19 Vaccine Information can be found at: [email or web address] For questions related to vaccine distribution or appointments, please email [email or web address] or call [phone].

## 2022-06-10 ENCOUNTER — Other Ambulatory Visit: Payer: Self-pay | Admitting: Cardiovascular Disease

## 2022-06-11 ENCOUNTER — Telehealth: Payer: Self-pay

## 2022-06-11 NOTE — Telephone Encounter (Signed)
Noted  

## 2022-06-11 NOTE — Telephone Encounter (Signed)
PA Authorization needed for Corlanor 7.5 mg, take 2 tabs po once for 1 dose. Take 2 hours prior to CT Scan. [facility] [address] [place], [place] [id number] # [phone] Fax [phone]. Plan# [id number], ID [id number].

## 2022-06-16 ENCOUNTER — Other Ambulatory Visit (HOSPITAL_COMMUNITY): Payer: Self-pay | Admitting: *Deleted

## 2022-06-16 ENCOUNTER — Telehealth (HOSPITAL_COMMUNITY): Payer: Self-pay | Admitting: *Deleted

## 2022-06-16 MED ORDER — IVABRADINE HCL 5 MG PO TABS: ORAL_TABLET | ORAL | 0 refills | Status: DC

## 2022-06-16 NOTE — Telephone Encounter (Signed)
Reaching out to patient to offer assistance regarding upcoming cardiac imaging study; pt verbalizes understanding of appt date/time, parking situation and where to check in, pre-test NPO status and medications ordered, and verified current allergies; name and call back number provided for further questions should they arise  Larey Brick RN Navigator Cardiac Imaging [facility] [phone] office [phone] cell  Patient to take 100mg  metoprolol tartrate and 15mg  ivabradine TWO hours prior to her cardiac CT scan.

## 2022-06-17 ENCOUNTER — Other Ambulatory Visit: Payer: Self-pay

## 2022-06-17 ENCOUNTER — Ambulatory Visit
Admission: RE | Admit: 2022-06-17 | Discharge: 2022-06-17 | Disposition: A | Discharge status: Home / Self Care | Payer: Medicare PPO | Source: Ambulatory Visit | Attending: Cardiovascular Disease | Admitting: Cardiovascular Disease

## 2022-06-17 ENCOUNTER — Emergency Department
Admission: EM | Admit: 2022-06-17 | Discharge: 2022-06-17 | Discharge status: Against Medical Advice | Payer: Medicare PPO | Attending: Cardiovascular Disease | Admitting: Cardiovascular Disease

## 2022-06-17 DIAGNOSIS — I2510 Atherosclerotic heart disease of native coronary artery without angina pectoris: Secondary | ICD-10-CM

## 2022-06-17 DIAGNOSIS — T80818A Extravasation of other vesicant agent, initial encounter: Secondary | ICD-10-CM | POA: Diagnosis not present

## 2022-06-17 DIAGNOSIS — I209 Angina pectoris, unspecified: Secondary | ICD-10-CM

## 2022-06-17 DIAGNOSIS — Z5321 Procedure and treatment not carried out due to patient leaving prior to being seen by health care provider: Secondary | ICD-10-CM | POA: Insufficient documentation

## 2022-06-17 DIAGNOSIS — M79602 Pain in left arm: Secondary | ICD-10-CM | POA: Diagnosis present

## 2022-06-17 MED ORDER — DILTIAZEM HCL 25 MG/5ML IV SOLN: 10.0000 mg | Freq: Once | INTRAVENOUS | Status: DC

## 2022-06-17 MED ORDER — NITROGLYCERIN 0.4 MG SL SUBL
0.8000 mg | SUBLINGUAL_TABLET | Freq: Once | SUBLINGUAL | Status: AC
  Administered 2022-06-17: 0.8 mg via SUBLINGUAL

## 2022-06-17 MED ORDER — METOPROLOL TARTRATE 5 MG/5ML IV SOLN
10.0000 mg | Freq: Once | INTRAVENOUS | Status: AC
  Administered 2022-06-17: 10 mg via INTRAVENOUS

## 2022-06-17 MED ORDER — IOHEXOL 350 MG/ML SOLN
100.0000 mL | Freq: Once | INTRAVENOUS | Status: AC | PRN
  Administered 2022-06-17: 100 mL via INTRAVENOUS

## 2022-06-17 NOTE — Progress Notes (Signed)
Patient had cardiac CT and during CT patient had pain left arm. Upon assessment patient states did not have pain after CT however had swelling around IV site. Elevated arm applied cold compress. Patient has full sensation arm, hand, and fingers with radial pulse +2 cap refill less than 3 seconds. Doctor Nadene Rubins notified who assessed patient and instructed patient to go to the ED for further evaluation and surgical consult. Patient verbalized understanding and friend who is with patient will take patient to the ED. The ED charge and triage nurse notified.

## 2022-06-17 NOTE — Discharge Instructions (Signed)
Keep arm elevated at home and apply ice.

## 2022-06-17 NOTE — ED Triage Notes (Signed)
Pt states she had an out pt CT and the IV blew and she got dye in her L arm and was sent here

## 2022-06-17 NOTE — Progress Notes (Addendum)
Ct tech stephainie notified the in house radiologist of the patient having a possible extravasation, dr Nadene Rubins, dr Nadene Rubins concerned about the amount of swelling distal to the iv site and concern for extravasation. IV removed without issue, pt tol well and reports no longer having pain in the area, area marked with a marker and an ice pack given to pt to place over the swelling, wrapped with a wash cloth. Pt instructed to go to the ER for evaluation of the swelling. Er charge RN Erskine Squibb and ER first nurse stephaine notified

## 2022-06-17 NOTE — ED Provider Triage Note (Signed)
Emergency Medicine Provider Triage Evaluation Note  Erica Camacho , a 71 y.o. female  was evaluated in triage.  Pt complains of dye extravasation after attempting to have a CT at her cardiologist office.  Patient was referred to the emergency department by cardiology.  Patient has discomfort at the extravasation site but no complaints of chest pain, chest tightness or shortness of breath.  Review of Systems  Positive: Patient has extravasation of contrast. Negative: No chest pain or abdominal pain.  Physical Exam  There were no vitals taken for this visit. Gen:   Awake, no distress   Resp:  Normal effort  MSK:   Moves extremities without difficulty  Other:    Medical Decision Making  Medically screening exam initiated at 1:25 PM.  Appropriate orders placed.  Erica Camacho was informed that the remainder of the evaluation will be completed by another provider, this initial triage assessment does not replace that evaluation, and the importance of remaining in the ED until their evaluation is complete.     Pia Mau [place], [place] 06/17/22 1326

## 2022-06-21 ENCOUNTER — Other Ambulatory Visit (HOSPITAL_COMMUNITY): Payer: Self-pay | Admitting: Cardiovascular Disease

## 2022-06-29 DIAGNOSIS — E782 Mixed hyperlipidemia: Secondary | ICD-10-CM | POA: Diagnosis not present

## 2022-06-29 DIAGNOSIS — E1165 Type 2 diabetes mellitus with hyperglycemia: Secondary | ICD-10-CM | POA: Diagnosis not present

## 2022-06-29 DIAGNOSIS — G4452 New daily persistent headache (NDPH): Secondary | ICD-10-CM | POA: Diagnosis not present

## 2022-06-29 DIAGNOSIS — I10 Essential (primary) hypertension: Secondary | ICD-10-CM | POA: Diagnosis not present

## 2022-07-02 ENCOUNTER — Other Ambulatory Visit (HOSPITAL_COMMUNITY): Payer: Self-pay | Admitting: *Deleted

## 2022-07-02 MED ORDER — METOPROLOL TARTRATE 100 MG PO TABS
100.0000 mg | ORAL_TABLET | Freq: Once | ORAL | 0 refills | Status: DC
Start: 2022-07-02 — End: 2024-04-03

## 2022-07-02 MED ORDER — IVABRADINE HCL 5 MG PO TABS
ORAL_TABLET | ORAL | 0 refills | Status: DC
Start: 2022-07-02 — End: 2024-04-03

## 2022-07-03 ENCOUNTER — Other Ambulatory Visit (HOSPITAL_COMMUNITY): Payer: Self-pay | Admitting: Cardiovascular Disease

## 2022-07-04 ENCOUNTER — Other Ambulatory Visit (HOSPITAL_COMMUNITY): Payer: Self-pay | Admitting: Cardiovascular Disease

## 2022-07-05 ENCOUNTER — Other Ambulatory Visit (HOSPITAL_COMMUNITY): Payer: Self-pay | Admitting: Cardiovascular Disease

## 2022-07-06 DIAGNOSIS — E039 Hypothyroidism, unspecified: Secondary | ICD-10-CM | POA: Diagnosis not present

## 2022-07-06 DIAGNOSIS — I10 Essential (primary) hypertension: Secondary | ICD-10-CM | POA: Diagnosis not present

## 2022-07-06 DIAGNOSIS — E1165 Type 2 diabetes mellitus with hyperglycemia: Secondary | ICD-10-CM | POA: Diagnosis not present

## 2022-07-06 DIAGNOSIS — E782 Mixed hyperlipidemia: Secondary | ICD-10-CM | POA: Diagnosis not present

## 2022-07-06 DIAGNOSIS — G4452 New daily persistent headache (NDPH): Secondary | ICD-10-CM | POA: Diagnosis not present

## 2022-07-08 ENCOUNTER — Inpatient Hospital Stay: Admission: RE | Admit: 2022-07-08 | Payer: Medicare PPO | Source: Ambulatory Visit

## 2022-07-13 ENCOUNTER — Telehealth (HOSPITAL_COMMUNITY): Payer: Self-pay | Admitting: *Deleted

## 2022-07-13 DIAGNOSIS — G4452 New daily persistent headache (NDPH): Secondary | ICD-10-CM | POA: Diagnosis not present

## 2022-07-13 DIAGNOSIS — I10 Essential (primary) hypertension: Secondary | ICD-10-CM | POA: Diagnosis not present

## 2022-07-13 DIAGNOSIS — E782 Mixed hyperlipidemia: Secondary | ICD-10-CM | POA: Diagnosis not present

## 2022-07-13 DIAGNOSIS — E1165 Type 2 diabetes mellitus with hyperglycemia: Secondary | ICD-10-CM | POA: Diagnosis not present

## 2022-07-13 DIAGNOSIS — E039 Hypothyroidism, unspecified: Secondary | ICD-10-CM | POA: Diagnosis not present

## 2022-07-13 NOTE — Telephone Encounter (Signed)
Attempted to call patient regarding upcoming cardiac CT appointment. °Left message on voicemail with name and callback number ° °Abhishek Levesque RN Navigator Cardiac Imaging °[facility] °[phone] Office °[phone] Cell ° °

## 2022-07-14 ENCOUNTER — Telehealth (HOSPITAL_COMMUNITY): Payer: Self-pay | Admitting: *Deleted

## 2022-07-14 NOTE — Telephone Encounter (Signed)
Reaching out to patient to offer assistance regarding upcoming cardiac imaging study; pt verbalizes understanding of appt date/time, parking situation and where to check in, pre-test NPO status and medications ordered, and verified current allergies; name and call back number provided for further questions should they arise  Arali Somera RN Navigator Cardiac Imaging [facility] [phone] office [phone] cell  Patient to take 100mg metoprolol tartrate and 15mg ivabradine two hours prior to her cardiac CT scan. 

## 2022-07-15 ENCOUNTER — Ambulatory Visit
Admission: RE | Admit: 2022-07-15 | Discharge: 2022-07-15 | Disposition: A | Discharge status: Home / Self Care | Payer: Medicare PPO | Source: Ambulatory Visit | Attending: Cardiovascular Disease | Admitting: Cardiovascular Disease

## 2022-07-15 ENCOUNTER — Other Ambulatory Visit: Payer: Self-pay | Admitting: Cardiovascular Disease

## 2022-07-15 DIAGNOSIS — I209 Angina pectoris, unspecified: Secondary | ICD-10-CM | POA: Insufficient documentation

## 2022-07-15 DIAGNOSIS — I2510 Atherosclerotic heart disease of native coronary artery without angina pectoris: Secondary | ICD-10-CM | POA: Insufficient documentation

## 2022-07-15 DIAGNOSIS — R931 Abnormal findings on diagnostic imaging of heart and coronary circulation: Secondary | ICD-10-CM

## 2022-07-15 LAB — POCT I-STAT CREATININE: Creatinine, Ser: 0.8 mg/dL (ref 0.44–1.00)

## 2022-07-15 MED ORDER — IOHEXOL 350 MG/ML SOLN
100.0000 mL | Freq: Once | INTRAVENOUS | Status: AC | PRN
  Administered 2022-07-15: 100 mL via INTRAVENOUS

## 2022-07-15 MED ORDER — NITROGLYCERIN 0.4 MG SL SUBL
0.8000 mg | SUBLINGUAL_TABLET | Freq: Once | SUBLINGUAL | Status: AC
  Administered 2022-07-15: 0.8 mg via SUBLINGUAL

## 2022-07-15 MED ORDER — METOPROLOL TARTRATE 5 MG/5ML IV SOLN
10.0000 mg | Freq: Once | INTRAVENOUS | Status: AC
  Administered 2022-07-15: 10 mg via INTRAVENOUS

## 2022-07-15 NOTE — Progress Notes (Signed)
Patient tolerated procedure well. Ambulate w/o difficulty. Denies light headedness or being dizzy. Sitting in chair drinking water provided. Encouraged to drink extra water today and reasoning explained. Verbalized understanding. All questions answered. ABC intact. No further needs. Discharge from procedure area w/o issues.   °

## 2022-10-13 DIAGNOSIS — G8929 Other chronic pain: Secondary | ICD-10-CM | POA: Diagnosis not present

## 2022-10-13 DIAGNOSIS — M25512 Pain in left shoulder: Secondary | ICD-10-CM | POA: Diagnosis not present

## 2022-10-13 DIAGNOSIS — M19012 Primary osteoarthritis, left shoulder: Secondary | ICD-10-CM | POA: Diagnosis not present

## 2022-12-14 DIAGNOSIS — E039 Hypothyroidism, unspecified: Secondary | ICD-10-CM | POA: Diagnosis not present

## 2022-12-14 DIAGNOSIS — E1165 Type 2 diabetes mellitus with hyperglycemia: Secondary | ICD-10-CM | POA: Diagnosis not present

## 2022-12-14 DIAGNOSIS — E782 Mixed hyperlipidemia: Secondary | ICD-10-CM | POA: Diagnosis not present

## 2022-12-14 DIAGNOSIS — I10 Essential (primary) hypertension: Secondary | ICD-10-CM | POA: Diagnosis not present

## 2022-12-29 ENCOUNTER — Telehealth: Payer: Self-pay

## 2022-12-29 NOTE — Telephone Encounter (Signed)
Appt 01/27/2023

## 2022-12-29 NOTE — Telephone Encounter (Signed)
Andee Poles said it would likely be for possible evaluation of NPH

## 2022-12-29 NOTE — Telephone Encounter (Signed)
-----  Message from Peggyann Shoals sent at 12/29/2022  8:12 AM EST ----- Regarding: self referral Contact: [phone] Patient is calling that Candie Mile gave her Dr.Yarbrough's information through her pastor. She had a scan last year that showed 2 cyst. Is this something that Dr.Y can see her for? She states she is having increased headaches.

## 2023-01-13 ENCOUNTER — Encounter: Payer: Self-pay | Admitting: Internal Medicine

## 2023-01-13 ENCOUNTER — Ambulatory Visit: Payer: Medicare PPO | Admitting: Nurse Practitioner

## 2023-01-13 ENCOUNTER — Ambulatory Visit: Payer: Medicare PPO | Admitting: Internal Medicine

## 2023-01-13 VITALS — BP 134/80 | HR 89 | Ht 61.0 in | Wt 183.0 lb

## 2023-01-13 DIAGNOSIS — N390 Urinary tract infection, site not specified: Secondary | ICD-10-CM

## 2023-01-13 DIAGNOSIS — E1169 Type 2 diabetes mellitus with other specified complication: Secondary | ICD-10-CM

## 2023-01-13 DIAGNOSIS — I10 Essential (primary) hypertension: Secondary | ICD-10-CM

## 2023-01-13 DIAGNOSIS — B3731 Acute candidiasis of vulva and vagina: Secondary | ICD-10-CM | POA: Diagnosis not present

## 2023-01-13 DIAGNOSIS — E669 Obesity, unspecified: Secondary | ICD-10-CM

## 2023-01-13 DIAGNOSIS — E782 Mixed hyperlipidemia: Secondary | ICD-10-CM

## 2023-01-13 DIAGNOSIS — E119 Type 2 diabetes mellitus without complications: Secondary | ICD-10-CM

## 2023-01-13 LAB — POCT URINALYSIS DIPSTICK
Bilirubin, UA: NEGATIVE
Glucose, UA: POSITIVE — AB
Ketones, UA: NEGATIVE
Leukocytes, UA: NEGATIVE
Nitrite, UA: NEGATIVE
Protein, UA: POSITIVE — AB
Spec Grav, UA: 1.01 (ref 1.010–1.025)
Urobilinogen, UA: 0.2 E.U./dL
pH, UA: 5 (ref 5.0–8.0)

## 2023-01-13 LAB — POCT CBG (FASTING - GLUCOSE)-MANUAL ENTRY: Glucose Fasting, POC: 219 mg/dL — AB (ref 70–99)

## 2023-01-13 MED ORDER — FLUCONAZOLE 150 MG PO TABS: 150.0000 mg | ORAL_TABLET | Freq: Every day | ORAL | 0 refills | Status: DC

## 2023-01-13 MED ORDER — GLIMEPIRIDE 4 MG PO TABS: 4.0000 mg | ORAL_TABLET | Freq: Two times a day (BID) | ORAL | 11 refills | Status: DC

## 2023-01-13 NOTE — Progress Notes (Signed)
Established Patient Office Visit  Subjective:  Patient ID: Erica Camacho, female    DOB: [birth year]  Age: 72 y.o. MRN: [id number]  Chief Complaint  Patient presents with   Follow-up    Med questions    Patient comes in today for a follow-up and also has complaints of vaginal  itching and burning.  Patient reports that since she was started on Jardiance at her last visit it somehow is affecting her kidneys and she feels pain and burning when she urinates.  Her most recent labs have actually shown a jump in her hemoglobin A1c.  Her urine dipstick today does not show any urinary tract infection.  I suspect that she has vaginal yeast as result of use of Jardiance.   Upon discussing patient states that she has never been able to tolerate most of the diabetic medications which include Metformin, Trulicity, Januvia and pioglitazone for 1 reason or another.  She states the Trulicity was stopped because it was very expensive.  However she mentions that she has used Glimepiride in the past at 4 mg twice a day and was tolerating it well that she was taken off it once Jardiance was started. Her fingerstick glucose is still very high today.  Patient agrees to start back on the glimepiride at 4 mg p.o. twice daily I will also send her a prescription for Diflucan tablets.  She was advised to maintain a very strict diabetic diet.  Patient has been actively exercising has and has shown some weight loss. She is going to schedule her diabetic eye exam.     Past Medical History:  Diagnosis Date   COPD (chronic obstructive pulmonary disease) (HCC)    Diabetes mellitus without complication (HCC)    Dyspnea    Heart murmur    Hyperlipidemia    Hypertension    Hypothyroidism    Thyroid disease    Vertigo    Wheezing    OCCAS    Social History   Socioeconomic History   Marital status: Single    Spouse name: Not on file   Number of children: Not on file   Years of education: Not on file   Highest  education level: Not on file  Occupational History   Not on file  Tobacco Use   Smoking status: Former   Smokeless tobacco: Never  Vaping Use   Vaping Use: Never used  Substance and Sexual Activity   Alcohol use: No   Drug use: No   Sexual activity: Not on file  Other Topics Concern   Not on file  Social History Narrative   Not on file   Social Determinants of Health   Financial Resource Strain: Not on file  Food Insecurity: Not on file  Transportation Needs: Not on file  Physical Activity: Not on file  Stress: Not on file  Social Connections: Not on file  Intimate Partner Violence: Not on file    History reviewed. No pertinent family history.  Allergies  Allergen Reactions   Aspirin Hives and Other (See Comments)    Allergic to aspirin that is not coated. Hives and GI upset   Ampicillin Hives   Carrot [Daucus Carota] Hives   Codeine Hives   Latex Itching   Penicillins Hives and Other (See Comments)    Has patient had a PCN reaction causing immediate rash, facial/tongue/throat swelling, SOB or lightheadedness with hypotension: No Has patient had a PCN reaction causing severe rash involving mucus membranes or skin necrosis: Yes  Has patient had a PCN reaction that required hospitalization: Yes Has patient had a PCN reaction occurring within the last 10 years: No If all of the above answers are "NO", then may proceed with Cephalosporin use.     Review of Systems  Constitutional:  Negative for chills, fever and malaise/fatigue.  HENT: Negative.    Eyes:  Negative for blurred vision, double vision, pain and redness.  Respiratory:  Negative for cough, shortness of breath and wheezing.   Cardiovascular:  Negative for chest pain, palpitations, leg swelling and PND.  Gastrointestinal:  Negative for abdominal pain, blood in stool, constipation, diarrhea, heartburn, nausea and vomiting.  Genitourinary:  Positive for frequency. Negative for dysuria, flank pain, hematuria and  urgency.  Musculoskeletal:  Negative for back pain, myalgias and neck pain.  Skin:  Negative for itching and rash.  Neurological:  Negative for dizziness, tingling, tremors, focal weakness and headaches.  Endo/Heme/Allergies:  Negative for polydipsia.  Psychiatric/Behavioral:  Negative for depression. The patient is not nervous/anxious.        Objective:   BP 134/80   Pulse 89   Ht 5' 1"$  (1.549 m)   Wt 183 lb (83 kg)   SpO2 94%   BMI 34.58 kg/m   Vitals:   01/13/23 1123  BP: 134/80  Pulse: 89  Height: 5' 1"$  (1.549 m)  Weight: 183 lb (83 kg)  SpO2: 94%  BMI (Calculated): 34.6    Physical Exam Vitals and nursing note reviewed.  Constitutional:      Appearance: Normal appearance.  HENT:     Head: Normocephalic.  Cardiovascular:     Rate and Rhythm: Normal rate and regular rhythm.  Pulmonary:     Effort: Pulmonary effort is normal.     Breath sounds: Normal breath sounds.  Abdominal:     General: Abdomen is flat.     Palpations: Abdomen is soft.  Musculoskeletal:        General: Normal range of motion.     Cervical back: Normal range of motion and neck supple.  Neurological:     General: No focal deficit present.     Mental Status: She is alert and oriented to person, place, and time.  Psychiatric:        Mood and Affect: Mood normal.      Results for orders placed or performed in visit on 01/13/23  POCT CBG (Fasting - Glucose)  Result Value Ref Range   Glucose Fasting, POC 219 (A) 70 - 99 mg/dL  POCT Urinalysis Dipstick (81002)  Result Value Ref Range   Color, UA     Clarity, UA Cloudy    Glucose, UA Positive (A) Negative   Bilirubin, UA Negative    Ketones, UA Negative    Spec Grav, UA 1.010 1.010 - 1.025   Blood, UA Trace- Intact    pH, UA 5.0 5.0 - 8.0   Protein, UA Positive (A) Negative   Urobilinogen, UA 0.2 0.2 or 1.0 E.U./dL   Nitrite, UA Negative    Leukocytes, UA Negative Negative   Appearance     Odor      Recent Results (from the  past 2160 hour(s))  POCT CBG (Fasting - Glucose)     Status: Abnormal   Collection Time: 01/13/23 11:35 AM  Result Value Ref Range   Glucose Fasting, POC 219 (A) 70 - 99 mg/dL  POCT Urinalysis Dipstick [id number])     Status: Abnormal   Collection Time: 01/13/23 11:36 AM  Result Value Ref  Range   Color, UA     Clarity, UA Cloudy    Glucose, UA Positive (A) Negative   Bilirubin, UA Negative    Ketones, UA Negative    Spec Grav, UA 1.010 1.010 - 1.025   Blood, UA Trace- Intact    pH, UA 5.0 5.0 - 8.0   Protein, UA Positive (A) Negative   Urobilinogen, UA 0.2 0.2 or 1.0 E.U./dL   Nitrite, UA Negative    Leukocytes, UA Negative Negative   Appearance     Odor        Assessment & Plan:   Problem List Items Addressed This Visit     Diabetes mellitus type 2 in obese (HCC)   Relevant Medications   empagliflozin (JARDIANCE) 25 MG TABS tablet   glimepiride (AMARYL) 4 MG tablet   Other Relevant Orders   POCT CBG (Fasting - Glucose) (Completed)   Essential hypertension   Relevant Medications   diltiazem (CARDIZEM CD) 120 MG 24 hr capsule   Vaginal yeast infection   Relevant Medications   fluconazole (DIFLUCAN) 150 MG tablet   Urinary tract infection without hematuria   Relevant Medications   fluconazole (DIFLUCAN) 150 MG tablet   Other Relevant Orders   POCT Urinalysis Dipstick [id number]) (Completed)   Mixed hyperlipidemia   Relevant Medications   diltiazem (CARDIZEM CD) 120 MG 24 hr capsule   Patient advised to continue Jardiance. Add Glimepride- and monitor blood sugars at home.  Return in about 10 days (around 01/23/2023).   Total time spent: 30 minutes  Perrin Maltese, MD  01/13/2023

## 2023-01-26 ENCOUNTER — Other Ambulatory Visit: Payer: Self-pay | Admitting: Internal Medicine

## 2023-01-26 DIAGNOSIS — B3731 Acute candidiasis of vulva and vagina: Secondary | ICD-10-CM

## 2023-01-26 NOTE — Progress Notes (Unsigned)
Referring Physician:  No referring provider defined for this encounter.  Primary Physician:  Evern Bio, NP  History of Present Illness: 01/27/2023 [name] is here today with a chief complaint of cyst in her brain.  Her ongoing issues for many years.  She reports that she had an event around age 72 when she required admission to the hospital for electroshock therapy.  She has not had recent imaging until last year when she was identified to have a possible cyst.  Past Surgery: denies  Erica Camacho has no symptoms of cervical myelopathy.  The symptoms are causing a significant impact on the patient's life.   I have utilized the care everywhere function in epic to review the outside records available from external health systems.  Review of Systems:  A 10 point review of systems is negative, except for the pertinent positives and negatives detailed in the HPI.  Past Medical History: Past Medical History:  Diagnosis Date   COPD (chronic obstructive pulmonary disease) ([place])    Diabetes mellitus without complication (HCC)    Dyspnea    Heart murmur    Hyperlipidemia    Hypertension    Hypothyroidism    Thyroid disease    Vertigo    Wheezing    OCCAS    Past Surgical History: Past Surgical History:  Procedure Laterality Date   CATARACT EXTRACTION W/PHACO Left 10/05/2018   Procedure: CATARACT EXTRACTION PHACO AND INTRAOCULAR LENS PLACEMENT ([place]);  Surgeon: Marchia Meiers, MD;  Location: [facility] ORS;  Service: Ophthalmology;  Laterality: Left;  [place] 00:46.5 CDE 8.29 Fluid Pack Lot # [id number]   CESAREAN SECTION     X 3   FRACTURE SURGERY      Allergies: Allergies as of 01/27/2023 - Review Complete 01/27/2023  Allergen Reaction Noted   Aspirin Hives and Other (See Comments) 02/07/2014   Ampicillin Hives 05/18/2016   Carrot [daucus carota] Hives 05/18/2016   Codeine Hives 04/26/2013   Latex Itching 09/22/2018   Penicillins Hives and Other (See Comments)  05/18/2016    Medications: Current Meds  Medication Sig   acetaminophen (TYLENOL) 500 MG tablet Take 500 mg by mouth daily as needed for moderate pain or headache.   albuterol (PROVENTIL HFA;VENTOLIN HFA) 108 (90 Base) MCG/ACT inhaler Inhale 2 puffs into the lungs every 6 (six) hours as needed for wheezing or shortness of breath.   amLODipine (NORVASC) 10 MG tablet Take 10 mg by mouth daily.   aspirin EC 81 MG tablet Take 81 mg by mouth daily.   atorvastatin (LIPITOR) 40 MG tablet Take 40 mg by mouth daily.   Cholecalciferol (VITAMIN D3) 1.25 MG (50000 UT) CAPS Take 1 capsule by mouth every 7 (seven) days.    diltiazem (CARDIZEM CD) 120 MG 24 hr capsule Take 120 mg by mouth daily.   empagliflozin (JARDIANCE) 25 MG TABS tablet Take 25 mg by mouth daily.   glimepiride (AMARYL) 4 MG tablet Take 4 mg by mouth daily.   glimepiride (AMARYL) 4 MG tablet Take 1 tablet (4 mg total) by mouth 2 (two) times daily.   ivabradine (CORLANOR) 5 MG TABS tablet Take tablets ('15mg'$ ) TWO hours prior to your cardiac CT Scan.   levothyroxine (SYNTHROID) 137 MCG tablet Take 150 mcg by mouth daily.   losartan (COZAAR) 100 MG tablet Take 100 mg by mouth daily.   meclizine (ANTIVERT) 25 MG tablet Take 1 tablet (25 mg total) by mouth every 6 (six) hours as needed for dizziness or nausea.  metFORMIN (GLUCOPHAGE) 500 MG tablet Take 500 mg by mouth in the morning and at bedtime.   Multiple Vitamin (MULTI-VITAMIN) tablet Take 1 tablet by mouth every other day.   Multiple Vitamins-Minerals (EMERGEN-C IMMUNE) PACK Take 1 packet by mouth daily as needed (immune support).   Na Sulfate-K Sulfate-Mg Sulf (SUPREP BOWEL PREP KIT) 17.5-3.13-1.6 GM/177ML SOLN Take 1 kit by mouth as directed.   pioglitazone (ACTOS) 30 MG tablet Take 30 mg by mouth daily.   Tetrahydrozoline HCl (VISINE OP) Place 1 drop into both eyes daily as needed (dry eyes).   triamterene-hydrochlorothiazide (MAXZIDE-25) 37.5-25 MG tablet Take 1 tablet by mouth  daily.   TRULICITY 1.5 0000000 SOPN Inject into the skin.    Social History: Social History   Tobacco Use   Smoking status: Former   Smokeless tobacco: Never  Scientific laboratory technician Use: Never used  Substance Use Topics   Alcohol use: No   Drug use: No    Family Medical History: No family history on file.  Physical Examination: Vitals:   01/27/23 1055  BP: (!) 150/78  Pulse: 75  SpO2: 95%    General: Patient is well developed, well nourished, calm, collected, and in no apparent distress. Attention to examination is appropriate.  Neck:   Supple.  Full range of motion.  Respiratory: Patient is breathing without any difficulty.   NEUROLOGICAL:     Awake, alert, oriented to person, place, and time.  Speech is clear and fluent.   Cranial Nerves: Pupils equal round and reactive to light.  Facial tone is symmetric.  Facial sensation is symmetric. Shoulder shrug is symmetric. Tongue protrusion is midline.  There is no pronator drift.  ROM of spine: full.    Strength: Side Biceps Triceps Deltoid Interossei Grip Wrist Ext. Wrist Flex.  R '5 5 5 5 5 5 5  '$ L '5 5 5 5 5 5 5   '$ Side Iliopsoas Quads Hamstring PF DF EHL  R '5 5 5 5 5 5  '$ L '5 5 5 5 5 5   '$ Reflexes are 1+ and symmetric at the biceps, triceps, brachioradialis, patella and achilles.   Hoffman's is absent.   Bilateral upper and lower extremity sensation is intact to light touch.    No evidence of dysmetria noted.  Gait is normal.     Medical Decision Making  Imaging: MRI Brain 04/22/2022 IMPRESSION: 1. No acute intracranial process. No evidence of acute or subacute infarct. 2. Loss of the flow void in the right transverse and sigmoid sinus, which could indicate slow flow but could also be seen in the setting of venous sinus thrombosis. Correlate with symptoms and consider CT venogram if clinically indicated.   These results were called by telephone at the time of interpretation on 04/22/2022 at 7:17 pm to  provider PHILLIP STAFFORD , who verbally acknowledged these results.     Electronically Signed   By: Merilyn Baba M.D.   On: 04/22/2022 19:18  I have personally reviewed the images and agree with the above interpretation.  Assessment and Plan: Erica Camacho is a pleasant 72 y.o. 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  I do not think she has normal pressure hydrocephalus.  I have recommended against any further imaging or surgical intervention.  I will see her back on an as-needed basis.    Thank you for  involving me in the care of this patient.      Benuel Ly K. Izora Ribas MD, [facility] Neurosurgery

## 2023-01-27 ENCOUNTER — Ambulatory Visit: Payer: Medicare PPO | Admitting: Neurosurgery

## 2023-01-27 ENCOUNTER — Encounter: Payer: Self-pay | Admitting: Neurosurgery

## 2023-01-27 VITALS — BP 150/78 | HR 75 | Ht 61.0 in | Wt 182.4 lb

## 2023-01-27 DIAGNOSIS — G930 Cerebral cysts: Secondary | ICD-10-CM

## 2023-01-27 DIAGNOSIS — Z8673 Personal history of transient ischemic attack (TIA), and cerebral infarction without residual deficits: Secondary | ICD-10-CM

## 2023-01-28 ENCOUNTER — Ambulatory Visit: Payer: Medicare PPO | Admitting: Internal Medicine

## 2023-01-29 ENCOUNTER — Other Ambulatory Visit: Payer: Self-pay | Admitting: Nurse Practitioner

## 2023-02-01 ENCOUNTER — Other Ambulatory Visit: Payer: Self-pay

## 2023-02-01 MED ORDER — DILTIAZEM HCL ER COATED BEADS 120 MG PO CP24: 120.0000 mg | ORAL_CAPSULE | Freq: Every day | ORAL | 1 refills | Status: DC

## 2023-02-02 ENCOUNTER — Telehealth: Payer: Self-pay

## 2023-02-02 NOTE — Telephone Encounter (Signed)
Pharmacy called asking for clarification on the two diltiazem rxs that were sent in, states that once is a "CD and one is a LA"

## 2023-02-04 ENCOUNTER — Ambulatory Visit (INDEPENDENT_AMBULATORY_CARE_PROVIDER_SITE_OTHER): Payer: Medicare PPO | Admitting: Internal Medicine

## 2023-02-04 ENCOUNTER — Encounter: Payer: Self-pay | Admitting: Internal Medicine

## 2023-02-04 VITALS — BP 134/70 | HR 92 | Ht 62.0 in | Wt 184.0 lb

## 2023-02-04 DIAGNOSIS — E039 Hypothyroidism, unspecified: Secondary | ICD-10-CM | POA: Insufficient documentation

## 2023-02-04 DIAGNOSIS — E1169 Type 2 diabetes mellitus with other specified complication: Secondary | ICD-10-CM | POA: Diagnosis not present

## 2023-02-04 DIAGNOSIS — I10 Essential (primary) hypertension: Secondary | ICD-10-CM

## 2023-02-04 DIAGNOSIS — E782 Mixed hyperlipidemia: Secondary | ICD-10-CM | POA: Diagnosis not present

## 2023-02-04 DIAGNOSIS — D649 Anemia, unspecified: Secondary | ICD-10-CM | POA: Diagnosis not present

## 2023-02-04 DIAGNOSIS — E669 Obesity, unspecified: Secondary | ICD-10-CM

## 2023-02-04 HISTORY — DX: Anemia, unspecified: D64.9

## 2023-02-04 LAB — POCT CBG (FASTING - GLUCOSE)-MANUAL ENTRY: Glucose Fasting, POC: 206 mg/dL — AB (ref 70–99)

## 2023-02-04 NOTE — Progress Notes (Signed)
Established Patient Office Visit  Subjective:  Patient ID: Erica Camacho, female    DOB: 1951/04/28  Age: 72 y.o. MRN: [id number]  Chief Complaint  Patient presents with   Follow-up    10 day follow up    Patient comes in for her follow-up today.  She was seen recently for vaginal yeast infection which she states it started after she was prescribed Jardiance.  Patient has not been able to use most of antidiabetic medications for some  reason or another.  Currently she is only on Jardiance 25 mg /d and  Amaryl that was added at the last visit. Her fingerstick glucose is still very high today.  Patient admits that she had  diluted peach juice today with breakfast.  And she is also only taking Amaryl once a day instead of as prescribed twice a day. Patient advised that she needs to adhere to a very strict diabetic diet and to take her medications regularly.   Use of basal insulin also discussed today.  Patient wants to try a very strict diet control, and then if her next hemoglobin A1c does not show any improvement,she will agree to start insulin.     Past Medical History:  Diagnosis Date   Anemia 02/04/2023   COPD (chronic obstructive pulmonary disease) (HCC)    Diabetes mellitus without complication (HCC)    Dyspnea    Heart murmur    Hyperlipidemia    Hypertension    Hypothyroidism    Thyroid disease    Vertigo    Wheezing    OCCAS    Past Surgical History:  Procedure Laterality Date   CATARACT EXTRACTION W/PHACO Left 10/05/2018   Procedure: CATARACT EXTRACTION PHACO AND INTRAOCULAR LENS PLACEMENT ([place]);  Surgeon: Marchia Meiers, MD;  Location: [facility] ORS;  Service: Ophthalmology;  Laterality: Left;  [place] 00:46.5 CDE 8.29 Fluid Pack Lot # [id number]   CESAREAN SECTION     X 3   FRACTURE SURGERY      Social History   Socioeconomic History   Marital status: Single    Spouse name: Not on file   Number of children: Not on file   Years of education: Not on file   Highest  education level: Not on file  Occupational History   Not on file  Tobacco Use   Smoking status: Former   Smokeless tobacco: Never  Vaping Use   Vaping Use: Never used  Substance and Sexual Activity   Alcohol use: No   Drug use: No   Sexual activity: Not on file  Other Topics Concern   Not on file  Social History Narrative   Not on file   Social Determinants of Health   Financial Resource Strain: Not on file  Food Insecurity: Not on file  Transportation Needs: Not on file  Physical Activity: Not on file  Stress: Not on file  Social Connections: Not on file  Intimate Partner Violence: Not on file    History reviewed. No pertinent family history.  Allergies  Allergen Reactions   Aspirin Hives and Other (See Comments)    Allergic to aspirin that is not coated. Hives and GI upset   Ampicillin Hives   Carrot [Daucus Carota] Hives   Codeine Hives   Latex Itching   Penicillins Hives and Other (See Comments)    Has patient had a PCN reaction causing immediate rash, facial/tongue/throat swelling, SOB or lightheadedness with hypotension: No Has patient had a PCN reaction causing severe rash involving mucus  membranes or skin necrosis: Yes Has patient had a PCN reaction that required hospitalization: Yes Has patient had a PCN reaction occurring within the last 10 years: No If all of the above answers are "NO", then may proceed with Cephalosporin use.     Review of Systems  Constitutional:  Negative for chills, fever, malaise/fatigue and weight loss.  HENT: Negative.    Eyes: Negative.   Respiratory: Negative.    Cardiovascular: Negative.   Gastrointestinal: Negative.   Genitourinary: Negative.   Musculoskeletal: Negative.   Skin: Negative.   Neurological: Negative.   Psychiatric/Behavioral: Negative.         Objective:   BP 134/70   Pulse 92   Ht '5\' 2"'$  (1.575 m)   Wt 184 lb (83.5 kg)   SpO2 95%   BMI 33.65 kg/m   Vitals:   02/04/23 1026  BP: 134/70   Pulse: 92  Height: '5\' 2"'$  (1.575 m)  Weight: 184 lb (83.5 kg)  SpO2: 95%  BMI (Calculated): 33.65    Physical Exam Constitutional:      Appearance: Normal appearance. She is obese.  HENT:     Head: Normocephalic.  Cardiovascular:     Rate and Rhythm: Normal rate and regular rhythm.  Pulmonary:     Effort: Pulmonary effort is normal.     Breath sounds: Normal breath sounds.  Abdominal:     General: Abdomen is flat. Bowel sounds are normal.     Palpations: Abdomen is soft.  Musculoskeletal:        General: Normal range of motion.     Cervical back: Normal range of motion.  Skin:    General: Skin is warm.  Neurological:     General: No focal deficit present.     Mental Status: She is alert.      Results for orders placed or performed in visit on 02/04/23  POCT CBG (Fasting - Glucose)  Result Value Ref Range   Glucose Fasting, POC 206 (A) 70 - 99 mg/dL    Recent Results (from the past 2160 hour(s))  POCT CBG (Fasting - Glucose)     Status: Abnormal   Collection Time: 01/13/23 11:35 AM  Result Value Ref Range   Glucose Fasting, POC 219 (A) 70 - 99 mg/dL  POCT Urinalysis Dipstick [id number])     Status: Abnormal   Collection Time: 01/13/23 11:36 AM  Result Value Ref Range   Color, UA     Clarity, UA Cloudy    Glucose, UA Positive (A) Negative   Bilirubin, UA Negative    Ketones, UA Negative    Spec Grav, UA 1.010 1.010 - 1.025   Blood, UA Trace- Intact    pH, UA 5.0 5.0 - 8.0   Protein, UA Positive (A) Negative   Urobilinogen, UA 0.2 0.2 or 1.0 E.U./dL   Nitrite, UA Negative    Leukocytes, UA Negative Negative   Appearance     Odor    POCT CBG (Fasting - Glucose)     Status: Abnormal   Collection Time: 02/04/23 10:33 AM  Result Value Ref Range   Glucose Fasting, POC 206 (A) 70 - 99 mg/dL      Assessment & Plan:  Patient advised to take her medications regularly.  She is also advised to adhere to a very  strict diet control.  She will get fasting blood work  prior to her next visit. Problem List Items Addressed This Visit     Diabetes mellitus type 2 in obese (  [place]) - Primary   Relevant Orders   POCT CBG (Fasting - Glucose) (Completed)   Hemoglobin A1c   Essential hypertension, benign   Relevant Orders   CMP14+EGFR   Mixed hyperlipidemia   Relevant Orders   Lipid Panel w/o Chol/HDL Ratio   Hypothyroidism   Relevant Orders   TSH   Anemia   Relevant Orders   CBC With Differential    Return in about 4 weeks (around 03/04/2023).   Total time spent: 30 minutes  Perrin Maltese, MD  02/04/2023

## 2023-02-07 NOTE — Telephone Encounter (Signed)
Please let pt know I have called the pharmacy to see which one she is taking and it is Diltiazem 120 mg tablets.  They are filling it now.  Thanks

## 2023-02-14 ENCOUNTER — Other Ambulatory Visit: Payer: Self-pay | Admitting: Nurse Practitioner

## 2023-02-14 DIAGNOSIS — B3731 Acute candidiasis of vulva and vagina: Secondary | ICD-10-CM

## 2023-02-16 ENCOUNTER — Other Ambulatory Visit: Payer: Self-pay | Admitting: Nurse Practitioner

## 2023-02-16 ENCOUNTER — Telehealth: Payer: Self-pay

## 2023-02-16 DIAGNOSIS — B3731 Acute candidiasis of vulva and vagina: Secondary | ICD-10-CM

## 2023-02-16 NOTE — Telephone Encounter (Signed)
Patient called stating that since taking the jardiance she keeps getting yeast infections can we send her in something to help with this?

## 2023-02-18 ENCOUNTER — Encounter: Payer: Self-pay | Admitting: Internal Medicine

## 2023-02-18 ENCOUNTER — Ambulatory Visit: Payer: Medicare PPO | Admitting: Internal Medicine

## 2023-02-18 ENCOUNTER — Other Ambulatory Visit: Payer: Self-pay

## 2023-02-18 VITALS — BP 132/70 | HR 90 | Ht 62.0 in | Wt 185.0 lb

## 2023-02-18 DIAGNOSIS — E039 Hypothyroidism, unspecified: Secondary | ICD-10-CM | POA: Diagnosis not present

## 2023-02-18 DIAGNOSIS — I10 Essential (primary) hypertension: Secondary | ICD-10-CM | POA: Diagnosis not present

## 2023-02-18 DIAGNOSIS — E1165 Type 2 diabetes mellitus with hyperglycemia: Secondary | ICD-10-CM | POA: Diagnosis not present

## 2023-02-18 DIAGNOSIS — B3731 Acute candidiasis of vulva and vagina: Secondary | ICD-10-CM

## 2023-02-18 DIAGNOSIS — E782 Mixed hyperlipidemia: Secondary | ICD-10-CM

## 2023-02-18 LAB — POCT CBG (FASTING - GLUCOSE)-MANUAL ENTRY: Glucose Fasting, POC: 100 mg/dL — AB (ref 70–99)

## 2023-02-18 MED ORDER — SITAGLIPTIN PHOSPHATE 50 MG PO TABS: 50.0000 mg | ORAL_TABLET | Freq: Every day | ORAL | 6 refills | Status: DC

## 2023-02-18 MED ORDER — ACCU-CHEK GUIDE VI STRP: ORAL_STRIP | 3 refills | Status: DC

## 2023-02-18 NOTE — Progress Notes (Signed)
Established Patient Office Visit  Subjective:  Patient ID: Erica Camacho, female    DOB: [birth year]  Age: 72 y.o. MRN: [id number]  Chief Complaint  Patient presents with   Follow-up    Discuss meds    Patient comes in today with concerns about recurrent vaginal yeast infections while taking Jardiance.  At her last visit her Amaryl was adjusted to be taken twice a day along with her Jardiance.  Since then her blood sugars are under much better control, but patient's concern is genuine that she has to keep taking medication to treat her vaginal yeast. She reports that some time ago she was taking Januvia which worked quite well but she is not sure why it was stopped.  We will give it a try, and send a prescription for Januvia 50 mg once a day.  Later on can be increased to 100 mg/day.  Meanwhile she will also continue taking her Amaryl twice a day.  Next set of labs are due in April. Further management and adjustment of doses can be done at that time.    No other concerns at this time.   Past Medical History:  Diagnosis Date   Anemia 02/04/2023   COPD (chronic obstructive pulmonary disease) (HCC)    Diabetes mellitus without complication (HCC)    Dyspnea    Heart murmur    Hyperlipidemia    Hypertension    Hypothyroidism    Thyroid disease    Vertigo    Wheezing    OCCAS    Past Surgical History:  Procedure Laterality Date   CATARACT EXTRACTION W/PHACO Left 10/05/2018   Procedure: CATARACT EXTRACTION PHACO AND INTRAOCULAR LENS PLACEMENT ([place]);  Surgeon: Marchia Meiers, MD;  Location: [facility] ORS;  Service: Ophthalmology;  Laterality: Left;  [place] 00:46.5 CDE 8.29 Fluid Pack Lot # [id number]   CESAREAN SECTION     X 3   FRACTURE SURGERY      Social History   Socioeconomic History   Marital status: Single    Spouse name: Not on file   Number of children: Not on file   Years of education: Not on file   Highest education level: Not on file  Occupational History   Not on file   Tobacco Use   Smoking status: Former   Smokeless tobacco: Never  Vaping Use   Vaping Use: Never used  Substance and Sexual Activity   Alcohol use: No   Drug use: No   Sexual activity: Not on file  Other Topics Concern   Not on file  Social History Narrative   Not on file   Social Determinants of Health   Financial Resource Strain: Not on file  Food Insecurity: Not on file  Transportation Needs: Not on file  Physical Activity: Not on file  Stress: Not on file  Social Connections: Not on file  Intimate Partner Violence: Not on file    History reviewed. No pertinent family history.  Allergies  Allergen Reactions   Aspirin Hives and Other (See Comments)    Allergic to aspirin that is not coated. Hives and GI upset   Ampicillin Hives   Carrot [Daucus Carota] Hives   Codeine Hives   Latex Itching   Penicillins Hives and Other (See Comments)    Has patient had a PCN reaction causing immediate rash, facial/tongue/throat swelling, SOB or lightheadedness with hypotension: No Has patient had a PCN reaction causing severe rash involving mucus membranes or skin necrosis: Yes Has patient had a  PCN reaction that required hospitalization: Yes Has patient had a PCN reaction occurring within the last 10 years: No If all of the above answers are "NO", then may proceed with Cephalosporin use.     Review of Systems  Constitutional: Negative.   HENT: Negative.    Eyes: Negative.   Respiratory: Negative.    Gastrointestinal: Negative.   Genitourinary: Negative.   Musculoskeletal: Negative.   Neurological: Negative.   Psychiatric/Behavioral: Negative.         Objective:   BP 132/70   Pulse 90   Ht 5\' 2"  (1.575 m)   Wt 185 lb (83.9 kg)   SpO2 96%   BMI 33.84 kg/m   Vitals:   02/18/23 1140  BP: 132/70  Pulse: 90  Height: 5\' 2"  (1.575 m)  Weight: 185 lb (83.9 kg)  SpO2: 96%  BMI (Calculated): 33.83    Physical Exam Vitals and nursing note reviewed.   Constitutional:      Appearance: Normal appearance.  Cardiovascular:     Rate and Rhythm: Normal rate and regular rhythm.     Pulses: Normal pulses.     Heart sounds: Normal heart sounds.  Pulmonary:     Effort: Pulmonary effort is normal.     Breath sounds: Normal breath sounds.  Abdominal:     General: Abdomen is flat.     Palpations: Abdomen is soft.  Musculoskeletal:     Cervical back: Normal range of motion.  Neurological:     General: No focal deficit present.     Mental Status: She is alert and oriented to person, place, and time.      Results for orders placed or performed in visit on 02/18/23  POCT CBG (Fasting - Glucose)  Result Value Ref Range   Glucose Fasting, POC 100 (A) 70 - 99 mg/dL    Recent Results (from the past 2160 hour(s))  POCT CBG (Fasting - Glucose)     Status: Abnormal   Collection Time: 01/13/23 11:35 AM  Result Value Ref Range   Glucose Fasting, POC 219 (A) 70 - 99 mg/dL  POCT Urinalysis Dipstick [id number])     Status: Abnormal   Collection Time: 01/13/23 11:36 AM  Result Value Ref Range   Color, UA     Clarity, UA Cloudy    Glucose, UA Positive (A) Negative   Bilirubin, UA Negative    Ketones, UA Negative    Spec Grav, UA 1.010 1.010 - 1.025   Blood, UA Trace- Intact    pH, UA 5.0 5.0 - 8.0   Protein, UA Positive (A) Negative   Urobilinogen, UA 0.2 0.2 or 1.0 E.U./dL   Nitrite, UA Negative    Leukocytes, UA Negative Negative   Appearance     Odor    POCT CBG (Fasting - Glucose)     Status: Abnormal   Collection Time: 02/04/23 10:33 AM  Result Value Ref Range   Glucose Fasting, POC 206 (A) 70 - 99 mg/dL  POCT CBG (Fasting - Glucose)     Status: Abnormal   Collection Time: 02/18/23 11:45 AM  Result Value Ref Range   Glucose Fasting, POC 100 (A) 70 - 99 mg/dL      Assessment & Plan:  Will DC Jardiance.  Start Januvia at 50 mg p.o. daily Continue Amaryl twice a day and a strict diet control. Problem List Items Addressed This Visit      Type 2 diabetes mellitus with hyperglycemia, without long-term current use of insulin ([place]) -  Primary   Relevant Medications   sitaGLIPtin (JANUVIA) 50 MG tablet   Other Relevant Orders   POCT CBG (Fasting - Glucose) (Completed)   Essential hypertension, benign   Vaginal yeast infection   Mixed hyperlipidemia   Hypothyroidism    No follow-ups on file.   Total time spent: 25 minutes  Perrin Maltese, MD  02/18/2023

## 2023-03-18 ENCOUNTER — Ambulatory Visit: Payer: Medicare PPO | Admitting: Nurse Practitioner

## 2023-03-23 ENCOUNTER — Other Ambulatory Visit: Payer: Self-pay | Admitting: Nurse Practitioner

## 2023-03-25 ENCOUNTER — Other Ambulatory Visit: Payer: Self-pay | Admitting: Nurse Practitioner

## 2023-03-31 ENCOUNTER — Ambulatory Visit: Payer: Medicare PPO | Admitting: Nurse Practitioner

## 2023-04-13 ENCOUNTER — Other Ambulatory Visit: Payer: Self-pay | Admitting: Nurse Practitioner

## 2023-05-02 ENCOUNTER — Other Ambulatory Visit: Payer: Self-pay

## 2023-05-02 DIAGNOSIS — E1165 Type 2 diabetes mellitus with hyperglycemia: Secondary | ICD-10-CM

## 2023-05-02 DIAGNOSIS — E669 Obesity, unspecified: Secondary | ICD-10-CM

## 2023-05-04 MED ORDER — SITAGLIPTIN PHOSPHATE 50 MG PO TABS: 50.0000 mg | ORAL_TABLET | Freq: Every day | ORAL | 3 refills | Status: DC

## 2023-05-04 MED ORDER — TRIAMTERENE-HCTZ 37.5-25 MG PO TABS: 1.0000 | ORAL_TABLET | Freq: Every day | ORAL | 3 refills | Status: AC

## 2023-05-04 MED ORDER — LEVOTHYROXINE SODIUM 137 MCG PO TABS: ORAL_TABLET | ORAL | 3 refills | Status: AC

## 2023-05-04 MED ORDER — ATORVASTATIN CALCIUM 40 MG PO TABS: 40.0000 mg | ORAL_TABLET | Freq: Every day | ORAL | 3 refills | Status: DC

## 2023-05-04 MED ORDER — GLIMEPIRIDE 4 MG PO TABS: 4.0000 mg | ORAL_TABLET | Freq: Every day | ORAL | 3 refills | Status: DC

## 2023-05-04 MED ORDER — DILTIAZEM HCL ER COATED BEADS 120 MG PO CP24: 120.0000 mg | ORAL_CAPSULE | Freq: Every day | ORAL | 3 refills | Status: DC

## 2023-05-04 MED ORDER — LOSARTAN POTASSIUM 100 MG PO TABS: 100.0000 mg | ORAL_TABLET | Freq: Every day | ORAL | 3 refills | Status: DC

## 2023-05-06 ENCOUNTER — Other Ambulatory Visit: Payer: Self-pay | Admitting: Nurse Practitioner

## 2023-05-12 ENCOUNTER — Ambulatory Visit: Payer: Medicare PPO | Admitting: Nurse Practitioner

## 2023-05-24 ENCOUNTER — Other Ambulatory Visit: Payer: Medicare PPO

## 2023-05-24 DIAGNOSIS — E669 Obesity, unspecified: Secondary | ICD-10-CM | POA: Diagnosis not present

## 2023-05-24 DIAGNOSIS — E1169 Type 2 diabetes mellitus with other specified complication: Secondary | ICD-10-CM | POA: Diagnosis not present

## 2023-05-25 LAB — HEMOGLOBIN A1C
Est. average glucose Bld gHb Est-mCnc: 212 mg/dL
Hgb A1c MFr Bld: 9 % — ABNORMAL HIGH (ref 4.8–5.6)

## 2023-05-26 ENCOUNTER — Encounter: Payer: Self-pay | Admitting: Internal Medicine

## 2023-05-26 ENCOUNTER — Ambulatory Visit: Payer: Medicare PPO | Admitting: Internal Medicine

## 2023-05-26 VITALS — BP 170/80 | HR 94 | Ht 61.0 in | Wt 195.6 lb

## 2023-05-26 DIAGNOSIS — E1165 Type 2 diabetes mellitus with hyperglycemia: Secondary | ICD-10-CM | POA: Diagnosis not present

## 2023-05-26 DIAGNOSIS — E782 Mixed hyperlipidemia: Secondary | ICD-10-CM

## 2023-05-26 DIAGNOSIS — E038 Other specified hypothyroidism: Secondary | ICD-10-CM

## 2023-05-26 DIAGNOSIS — I10 Essential (primary) hypertension: Secondary | ICD-10-CM

## 2023-05-26 DIAGNOSIS — J4530 Mild persistent asthma, uncomplicated: Secondary | ICD-10-CM

## 2023-05-26 LAB — POCT CBG (FASTING - GLUCOSE)-MANUAL ENTRY: Glucose Fasting, POC: 202 mg/dL — AB (ref 70–99)

## 2023-05-26 MED ORDER — DAPAGLIFLOZIN PROPANEDIOL 10 MG PO TABS: 10.0000 mg | ORAL_TABLET | Freq: Every day | ORAL | 1 refills | Status: DC

## 2023-05-26 MED ORDER — ALBUTEROL SULFATE HFA 108 (90 BASE) MCG/ACT IN AERS
2.0000 | INHALATION_SPRAY | Freq: Four times a day (QID) | RESPIRATORY_TRACT | 0 refills | Status: DC | PRN
Start: 2023-05-26 — End: 2023-08-04

## 2023-05-26 MED ORDER — AIRSUPRA 90-80 MCG/ACT IN AERO: 1.0000 | INHALATION_SPRAY | Freq: Four times a day (QID) | RESPIRATORY_TRACT | 1 refills | Status: DC | PRN

## 2023-05-26 MED ORDER — SITAGLIPTIN PHOSPHATE 100 MG PO TABS
100.0000 mg | ORAL_TABLET | Freq: Every day | ORAL | 2 refills | Status: DC
Start: 2023-05-26 — End: 2023-12-09

## 2023-05-26 MED ORDER — SEMAGLUTIDE(0.25 OR 0.5MG/DOS) 2 MG/3ML ~~LOC~~ SOPN
0.2500 mg | PEN_INJECTOR | SUBCUTANEOUS | 3 refills | Status: AC
Start: 2023-05-26 — End: ?

## 2023-05-26 NOTE — Progress Notes (Signed)
Established Patient Office Visit  Subjective:  Patient ID: NETHA DAFOE, female    DOB: 09/11/1951  Age: 72 y.o. MRN: [id number]  Chief Complaint  Patient presents with   Follow-up    Lab F/U    Patient in office for regular follow up, discuss lab results. Patient complaining of vertigo. Patient has been walking for exercise, continues to gain weight. Hgb A1c improved but still elevated. Will add Ozempic. Increase Januvia to 100 mg daily. Add [place]. Developed yeast infection on Jardiance.  Mammogram done this year at [facility]. Due for Colonoscopy. Last one 01/2014 Repeat colonoscopy in 5 years for surveillance based on pathology results and due to family history.  Eye exam scheduled for 06/23/23 Requesting a refill on her inhaler, albuterol. Airsupra sample given today.  Dexa 02/2022 at [facility].    No other concerns at this time.   Past Medical History:  Diagnosis Date   Anemia 02/04/2023   COPD (chronic obstructive pulmonary disease) (HCC)    Diabetes mellitus without complication (HCC)    Dyspnea    Heart murmur    Hyperlipidemia    Hypertension    Hypothyroidism    Thyroid disease    Vertigo    Wheezing    OCCAS    Past Surgical History:  Procedure Laterality Date   CATARACT EXTRACTION W/PHACO Left 10/05/2018   Procedure: CATARACT EXTRACTION PHACO AND INTRAOCULAR LENS PLACEMENT ([facility]);  Surgeon: Elliot Cousin, MD;  Location: [facility] ORS;  Service: Ophthalmology;  Laterality: Left;  [place] 00:46.5 CDE 8.29 Fluid Pack Lot # [id number]   CESAREAN SECTION     X 3   FRACTURE SURGERY      Social History   Socioeconomic History   Marital status: Single    Spouse name: Not on file   Number of children: Not on file   Years of education: Not on file   Highest education level: Not on file  Occupational History   Not on file  Tobacco Use   Smoking status: Former   Smokeless tobacco: Never  Vaping Use   Vaping Use: Never used  Substance and Sexual Activity    Alcohol use: No   Drug use: No   Sexual activity: Not on file  Other Topics Concern   Not on file  Social History Narrative   Not on file   Social Determinants of Health   Financial Resource Strain: Not on file  Food Insecurity: Not on file  Transportation Needs: Not on file  Physical Activity: Not on file  Stress: Not on file  Social Connections: Not on file  Intimate Partner Violence: Not on file    History reviewed. No pertinent family history.  Allergies  Allergen Reactions   Aspirin Hives and Other (See Comments)    Allergic to aspirin that is not coated. Hives and GI upset   Ampicillin Hives   Carrot [Daucus Carota] Hives   Codeine Hives   Latex Itching   Penicillins Hives and Other (See Comments)    Has patient had a PCN reaction causing immediate rash, facial/tongue/throat swelling, SOB or lightheadedness with hypotension: No Has patient had a PCN reaction causing severe rash involving mucus membranes or skin necrosis: Yes Has patient had a PCN reaction that required hospitalization: Yes Has patient had a PCN reaction occurring within the last 10 years: No If all of the above answers are "NO", then may proceed with Cephalosporin use.     Review of Systems  Constitutional:  Positive for malaise/fatigue.  Negative for chills, fever and weight loss.  HENT: Negative.  Negative for sore throat and tinnitus.   Eyes: Negative.  Negative for blurred vision.  Respiratory: Negative.  Negative for cough, shortness of breath and wheezing.   Cardiovascular: Negative.  Negative for chest pain, palpitations and leg swelling.  Gastrointestinal: Negative.  Negative for abdominal pain, blood in stool, constipation, diarrhea, heartburn, melena, nausea and vomiting.  Genitourinary: Negative.  Negative for dysuria and flank pain.  Musculoskeletal: Negative.  Negative for joint pain and myalgias.  Skin: Negative.   Neurological:  Positive for dizziness. Negative for headaches.   Endo/Heme/Allergies: Negative.   Psychiatric/Behavioral: Negative.  Negative for depression and suicidal ideas. The patient is not nervous/anxious.        Objective:   BP (!) 170/80   Pulse 94   Ht 5\' 1"  (1.549 m)   Wt 195 lb 9.6 oz (88.7 kg)   SpO2 98%   BMI 36.96 kg/m   Vitals:   05/26/23 1054  BP: (!) 170/80  Pulse: 94  Height: 5\' 1"  (1.549 m)  Weight: 195 lb 9.6 oz (88.7 kg)  SpO2: 98%  BMI (Calculated): 36.98    Physical Exam Vitals and nursing note reviewed.  Constitutional:      Appearance: Normal appearance. She is obese.  HENT:     Head: Normocephalic and atraumatic.     Nose: Nose normal.     Mouth/Throat:     Pharynx: No oropharyngeal exudate or posterior oropharyngeal erythema.  Cardiovascular:     Rate and Rhythm: Normal rate and regular rhythm.     Pulses: Normal pulses.     Heart sounds: Normal heart sounds. No murmur heard. Pulmonary:     Effort: Pulmonary effort is normal.     Breath sounds: Normal breath sounds. No wheezing.  Abdominal:     General: Bowel sounds are normal.     Palpations: Abdomen is soft.     Tenderness: There is no abdominal tenderness. There is no right CVA tenderness or left CVA tenderness.  Musculoskeletal:        General: Normal range of motion.     Cervical back: Normal range of motion.     Right lower leg: No edema.     Left lower leg: No edema.  Skin:    General: Skin is warm and dry.  Neurological:     General: No focal deficit present.     Mental Status: She is alert and oriented to person, place, and time.     Gait: Gait normal.     Deep Tendon Reflexes: Reflexes normal.  Psychiatric:        Mood and Affect: Mood normal.        Behavior: Behavior normal.      Results for orders placed or performed in visit on 05/26/23  POCT CBG (Fasting - Glucose)  Result Value Ref Range   Glucose Fasting, POC 202 (A) 70 - 99 mg/dL    Recent Results (from the past 2160 hour(s))  Hemoglobin A1c     Status: Abnormal    Collection Time: 05/24/23  1:59 PM  Result Value Ref Range   Hgb A1c MFr Bld 9.0 (H) 4.8 - 5.6 %    Comment:          Prediabetes: 5.7 - 6.4          Diabetes: >6.4          Glycemic control for adults with diabetes: <7.0    Est. average  glucose Bld gHb Est-mCnc 212 mg/dL  POCT CBG (Fasting - Glucose)     Status: Abnormal   Collection Time: 05/26/23 11:02 AM  Result Value Ref Range   Glucose Fasting, POC 202 (A) 70 - 99 mg/dL      Assessment & Plan:  Continue diet and exercise. Start Ozempic and [place], increase Januvia. Will see back in 10 days to evaluate medication changes.   Problem List Items Addressed This Visit     Type 2 diabetes mellitus with hyperglycemia, without long-term current use of insulin (HCC) - Primary   Relevant Medications   sitaGLIPtin (JANUVIA) 100 MG tablet   Semaglutide,0.25 or 0.5MG /DOS, 2 MG/3ML SOPN   dapagliflozin propanediol (FARXIGA) 10 MG TABS tablet   Other Relevant Orders   POCT CBG (Fasting - Glucose) (Completed)   Essential hypertension, benign   Mixed hyperlipidemia   Hypothyroidism   Other Visit Diagnoses     Mild persistent asthma without complication       Relevant Medications   albuterol (VENTOLIN HFA) 108 (90 Base) MCG/ACT inhaler   Albuterol-Budesonide (AIRSUPRA) 90-80 MCG/ACT AERO       Return in about 10 days (around 06/05/2023).   Total time spent: 30 minutes  Margaretann Loveless, MD  05/26/2023   This document may have been prepared by [facility] Voice Recognition software and as such may include unintentional dictation errors.

## 2023-05-30 ENCOUNTER — Telehealth: Payer: Self-pay | Admitting: Nurse Practitioner

## 2023-05-30 MED ORDER — FLUTICASONE PROPIONATE 50 MCG/ACT NA SUSP: 1.0000 | Freq: Every day | NASAL | 2 refills | Status: AC

## 2023-05-30 MED ORDER — MECLIZINE HCL 25 MG PO TABS: 25.0000 mg | ORAL_TABLET | Freq: Four times a day (QID) | ORAL | 1 refills | Status: DC | PRN

## 2023-05-30 NOTE — Telephone Encounter (Signed)
-----   Message from Margaretann Loveless, MD sent at 05/30/2023 12:16 PM EDT ----- Regarding: FW: medication  Please call pt- she can take he rMeclizine three times a day as needed for dizziness. Add Flonase nasal spay , 1 spray each nostril daily. Will assess further at follow up next week. ----- Message ----- From: Orrin Brigham Sent: 05/30/2023   9:48 AM EDT To: Margaretann Loveless, MD Subject: medication                                     Patient called stating that her vertigo isn't getting any better and is asking is there anything that she can take for this? If something can be called in please [facility] in [place]. Please adv?

## 2023-05-30 NOTE — Telephone Encounter (Signed)
Spoke to patient and advised her of recommendations below. Patient states she does not have any meclizine so I sent new Rx plus the Flonase to pharmacy.

## 2023-06-07 ENCOUNTER — Ambulatory Visit: Payer: Medicare PPO | Admitting: Internal Medicine

## 2023-07-05 ENCOUNTER — Ambulatory Visit: Payer: Medicare PPO | Admitting: Internal Medicine

## 2023-08-04 ENCOUNTER — Other Ambulatory Visit: Payer: Self-pay | Admitting: Internal Medicine

## 2023-08-04 DIAGNOSIS — J4530 Mild persistent asthma, uncomplicated: Secondary | ICD-10-CM

## 2023-11-02 ENCOUNTER — Other Ambulatory Visit: Payer: Self-pay

## 2023-11-02 MED ORDER — DILTIAZEM HCL ER 120 MG PO TB24: 120.0000 mg | ORAL_TABLET | Freq: Every day | ORAL | 1 refills | Status: DC

## 2023-11-08 ENCOUNTER — Ambulatory Visit: Payer: Medicare PPO | Admitting: Internal Medicine

## 2023-11-09 ENCOUNTER — Other Ambulatory Visit: Payer: Self-pay

## 2023-11-09 MED ORDER — LOSARTAN POTASSIUM 100 MG PO TABS: 100.0000 mg | ORAL_TABLET | Freq: Every day | ORAL | 3 refills | Status: DC

## 2023-12-06 IMAGING — CT CT ANGIO CHEST
2 of 6 series · 18 of 46 positions shown · IV contrast (APPLIED)
Comparison: Chest radiographs 04/22/2022

CLINICAL DATA: Pulmonary embolism (PE) suspected, high prob.
Shortness of breath, dizziness, and generalized weakness. History of
COPD.

EXAM:
CT ANGIOGRAPHY CHEST WITH CONTRAST
TECHNIQUE: Multidetector CT imaging of the chest was performed using the
standard protocol during bolus administration of intravenous
contrast. Multiplanar CT image reconstructions and MIPs were
obtained to evaluate the vascular anatomy.

[Series 6: thins · axial · 0.67mm/px · z∈[-449,-229]mm · 15 of 347 slices shown]
[im 16/347  lung]
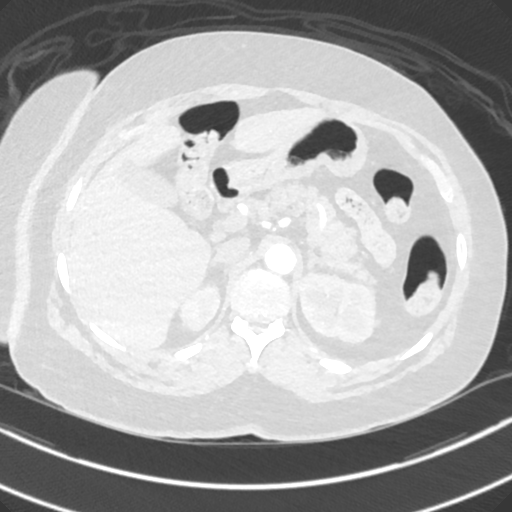
[im 46/347  soft-tissue]
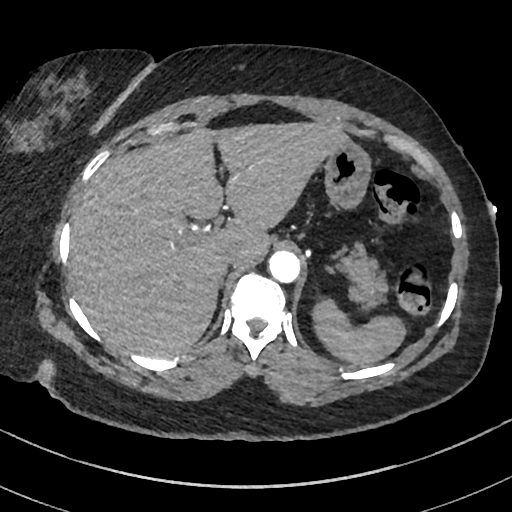
[im 61/347  lung]
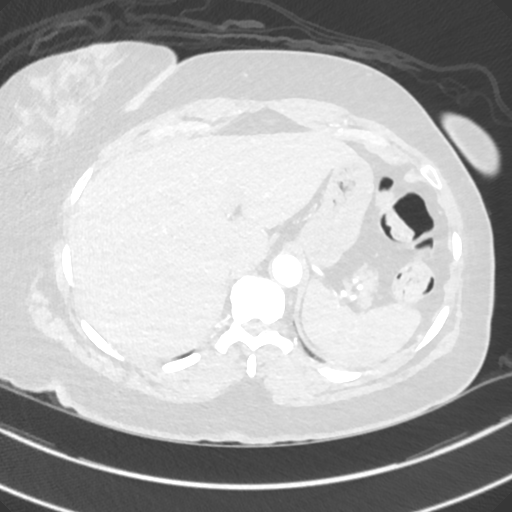
[im 91/347  soft-tissue]
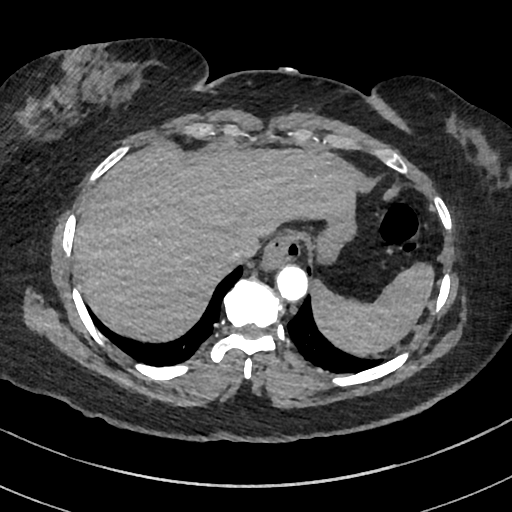
[im 106/347  lung]
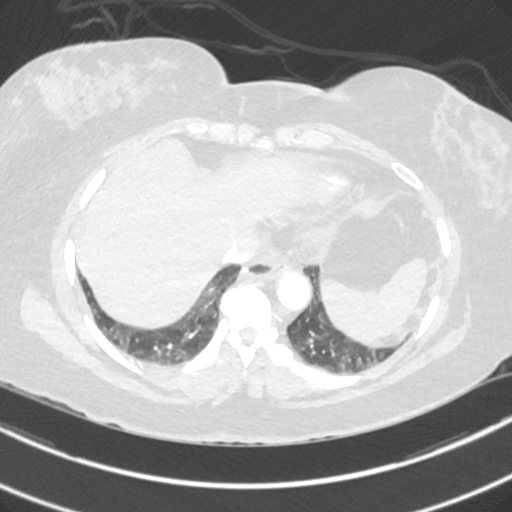
[im 136/347  soft-tissue]
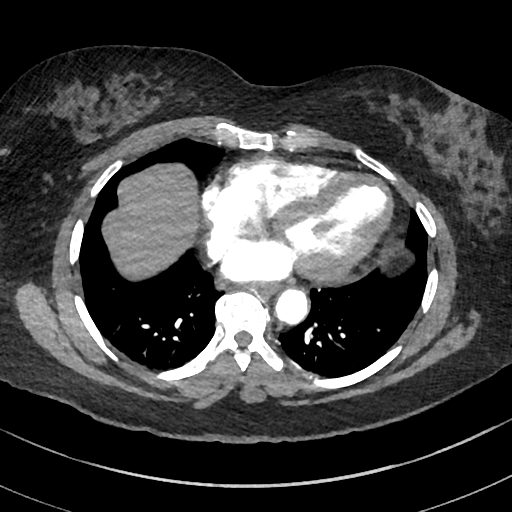
[im 151/347  lung]
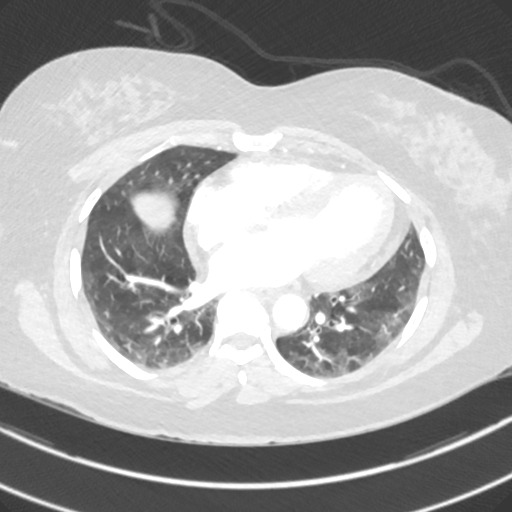
[im 181/347  soft-tissue]
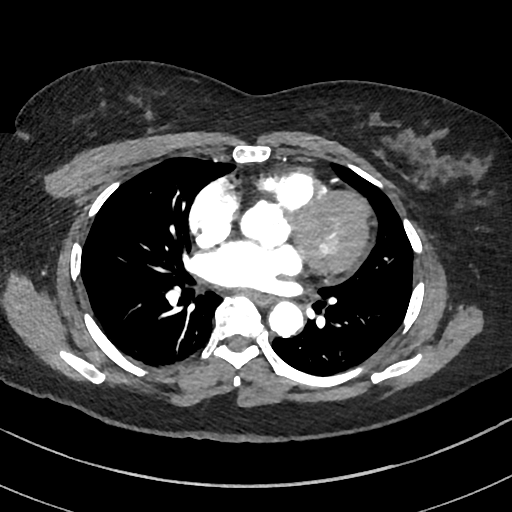
[im 196/347  lung]
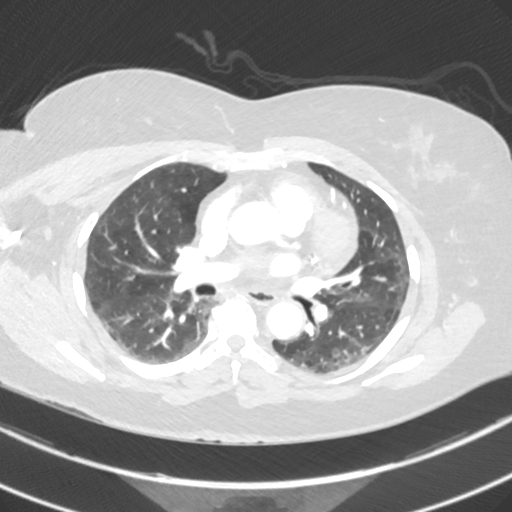
[im 211/347  soft-tissue]
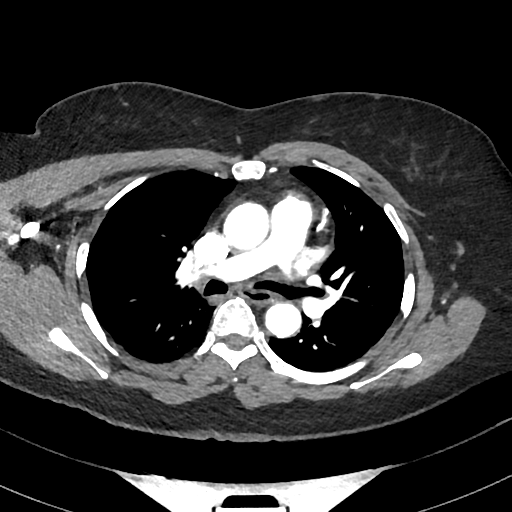
[im 241/347  lung]
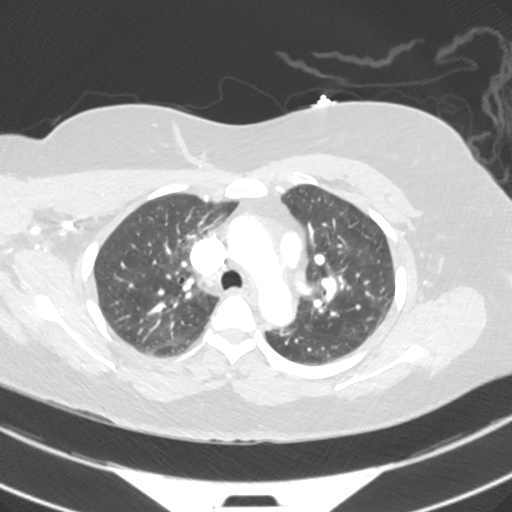
[im 256/347  soft-tissue]
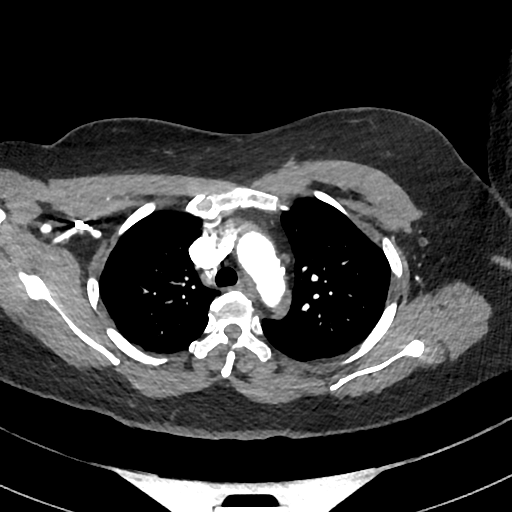
[im 286/347  lung]
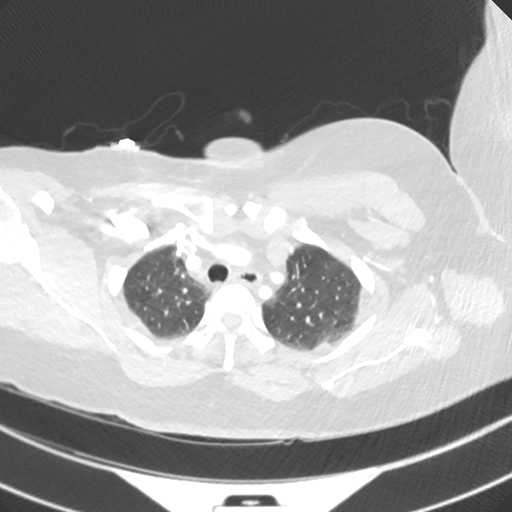
[im 301/347  soft-tissue]
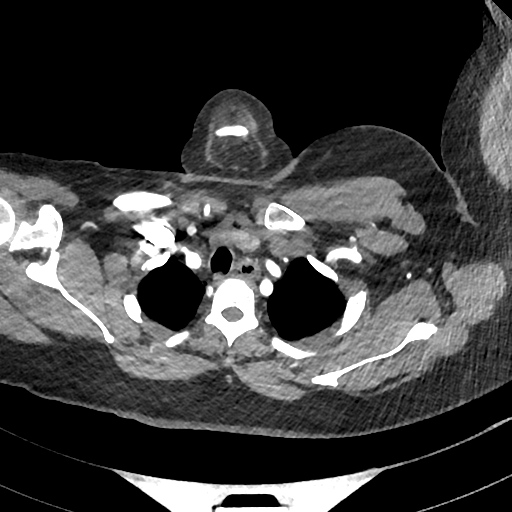
[im 331/347  lung]
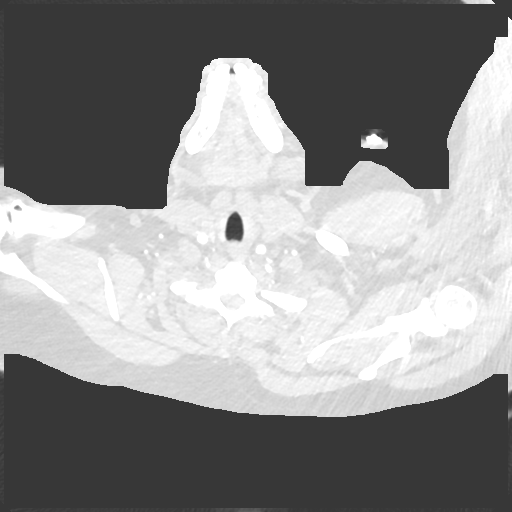

[Series 7: cor · coronal · 0.54mm/px · 3 of 118 slices shown]
[im 30/118  soft-tissue]
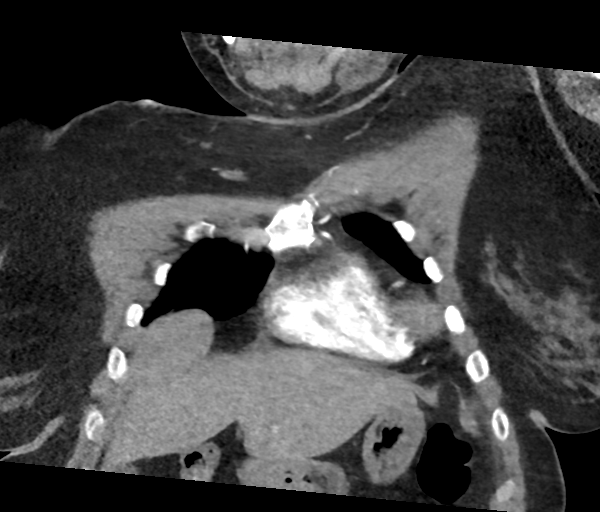
[im 59/118  soft-tissue]
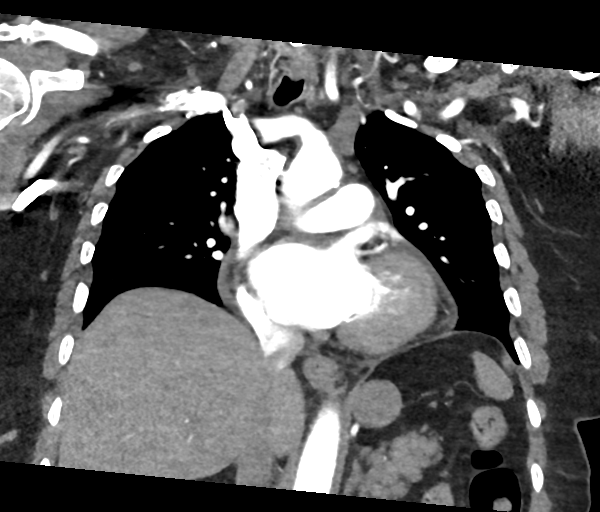
[im 88/118  soft-tissue]
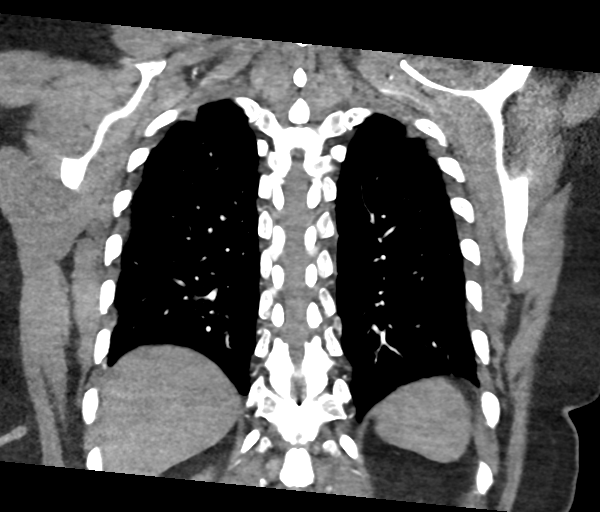

[18 of 46 positions shown; findings below may reference images not displayed]

RADIATION DOSE REDUCTION: This exam was performed according to the
departmental dose-optimization program which includes automated
exposure control, adjustment of the mA and/or kV according to
patient size and/or use of iterative reconstruction technique.

CONTRAST:  75mL OMNIPAQUE IOHEXOL 350 MG/ML SOLN
FINDINGS: Cardiovascular: Pulmonary arterial opacification is adequate without
evidence of emboli. There is mild thoracic aortic atherosclerosis
without evidence of dissection or aneurysm. LAD coronary
atherosclerosis is noted. The heart is mildly enlarged. There is no
significant pericardial effusion.

Mediastinum/Nodes: No enlarged axillary, mediastinal, or hilar lymph
nodes. Subcentimeter thyroid nodules with no follow-up imaging
recommended. Small sliding hiatal hernia.

Lungs/Pleura: No pleural effusion or pneumothorax. Mild respiratory
motion artifact with mild mosaic attenuation throughout the lungs.
No consolidative opacity. Small calcified granuloma in the right
lower lobe.

Upper Abdomen: Unremarkable.

Musculoskeletal: No acute osseous abnormality or suspicious osseous
lesion.

Review of the MIP images confirms the above findings.
IMPRESSION: 1. No evidence of pulmonary emboli or other acute abnormality in the
chest.
2. Small hiatal hernia.
3. Aortic Atherosclerosis ([id number]).

## 2023-12-06 IMAGING — MR MR HEAD W/O CM
11 series · 48 of 48 positions shown · non-contrast
Comparison: No prior MRI, correlation is made with CT head
04/22/2022

CLINICAL DATA: Shortness of breath, dizziness, weakness

EXAM:
MRI HEAD WITHOUT CONTRAST
TECHNIQUE: Multiplanar, multiecho pulse sequences of the brain and surrounding
structures were obtained without intravenous contrast.

[Series 5: ax dwi_tracew · axial · 3.0mm · 0.65mm/px · z∈[-115,+36]mm · 3 of 48 slices shown]
[im 1/48]
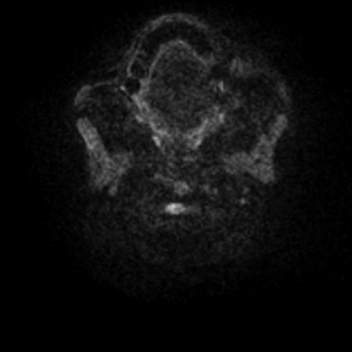
[im 24/48]
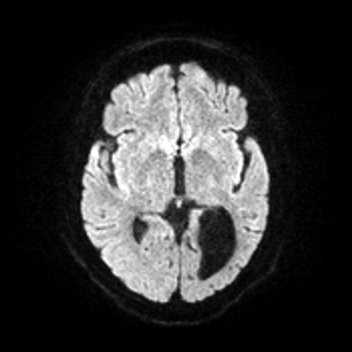
[im 48/48]
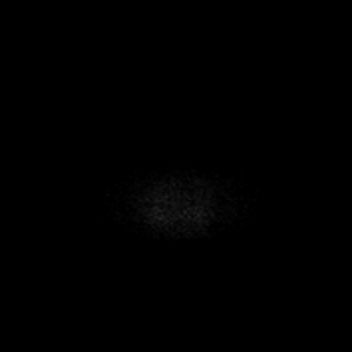

[Series 6: ax dwi_adc · axial · 3.0mm · 0.65mm/px · z∈[-115,+23]mm · 4 of 44 slices shown]
[im 1/44]
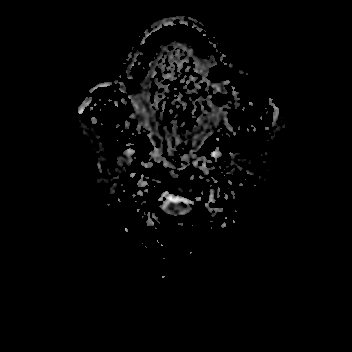
[im 15/44]
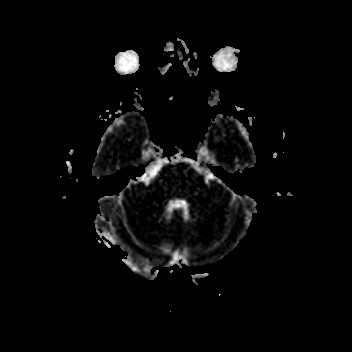
[im 29/44]
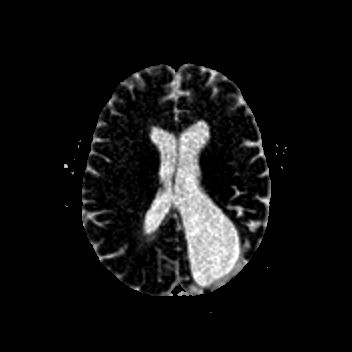
[im 44/44]
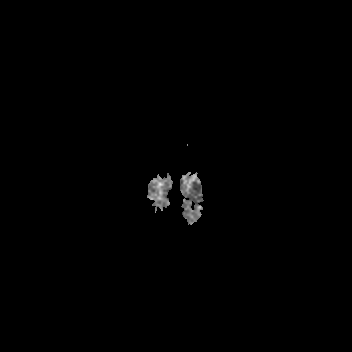

[Series 7: cor dwi_tracew · coronal · 5.0mm · 1.80mm/px · 3 of 38 slices shown]
[im 1/38]
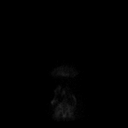
[im 19/38]
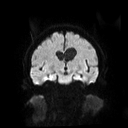
[im 38/38]
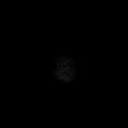

[Series 8: cor dwi_adc · coronal · 5.0mm · 1.80mm/px · 3 of 38 slices shown]
[im 1/38]
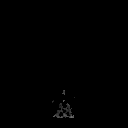
[im 19/38]
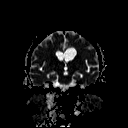
[im 38/38]
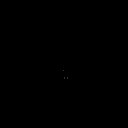

[Series 9: T1 · sagittal · 5.0mm · 0.62mm/px · 2 of 23 slices shown (1 of 2)]
[im 1/23]
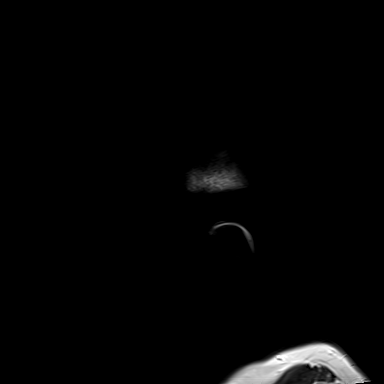
[im 23/23]
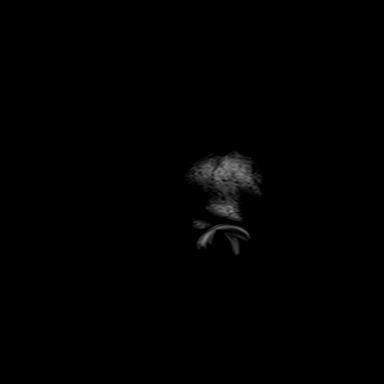

[Series 10: T2 · axial · 5.0mm · 0.86mm/px · z∈[-107,+33]mm · 2 of 25 slices shown (1 of 2)]
[im 1/25]
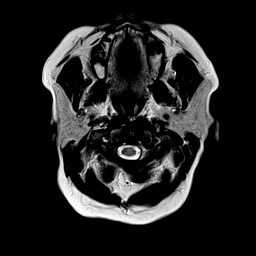
[im 25/25]
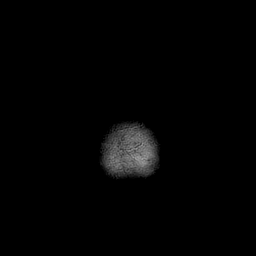

[Series 12: pha_images · axial · 3.0mm · 0.90mm/px · z∈[-110,+36]mm · 4 of 51 slices shown]
[im 1/51]
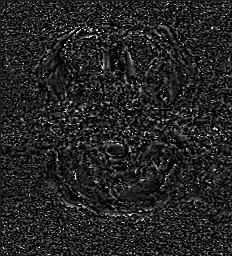
[im 17/51]
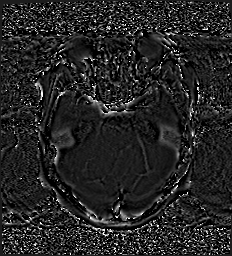
[im 34/51]
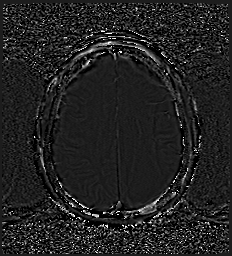
[im 51/51]
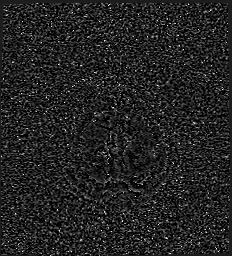

[Series 13: swi_images · axial · 3.0mm · 0.90mm/px · z∈[-113,+36]mm · 4 of 52 slices shown]
[im 1/52]
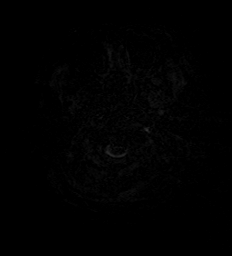
[im 18/52]
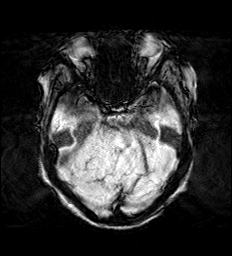
[im 35/52]
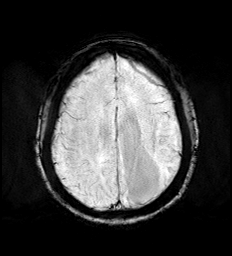
[im 52/52]
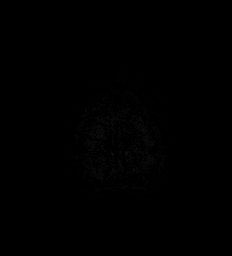

[Series 15: FLAIR · axial · 3.0mm · 0.69mm/px · z∈[-116,+42]mm · 5 of 55 slices shown]
[im 1/55]
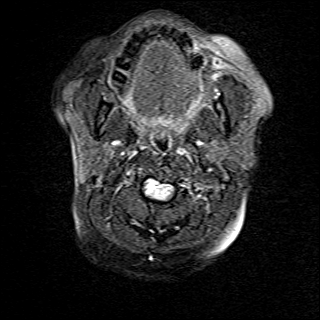
[im 14/55]
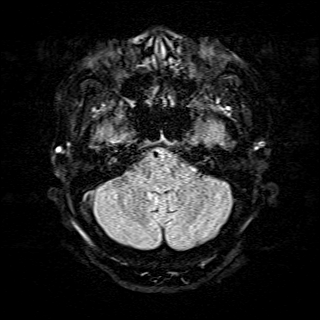
[im 28/55]
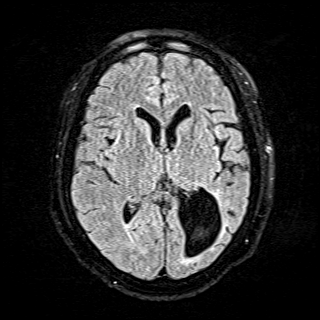
[im 41/55]
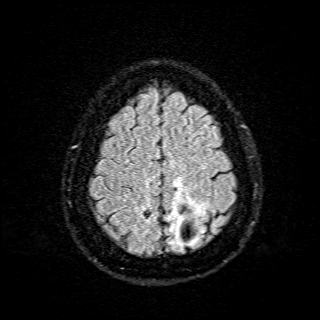
[im 55/55]
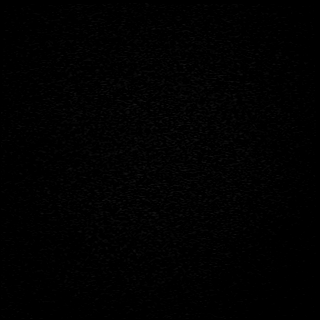

[Series 16: T1 · axial · 1.0mm · 0.98mm/px · z∈[-125,+46]mm · 15 of 176 slices shown (2 of 2)]
[im 1/176]
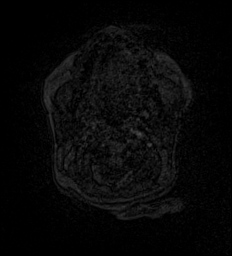
[im 13/176]
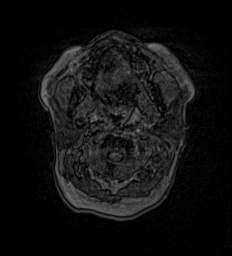
[im 26/176]
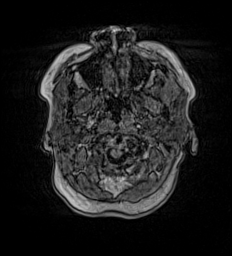
[im 38/176]
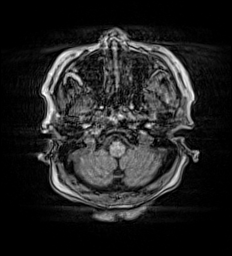
[im 51/176]
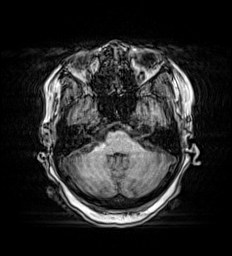
[im 63/176]
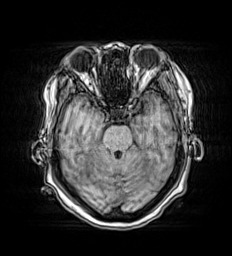
[im 76/176]
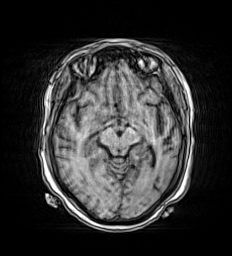
[im 88/176]
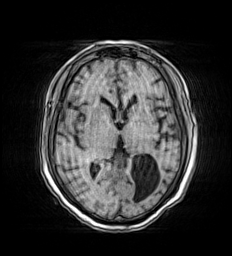
[im 101/176]
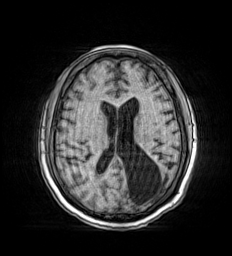
[im 113/176]
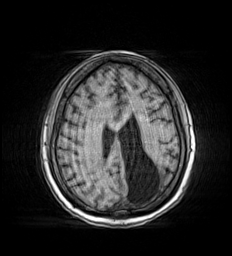
[im 126/176]
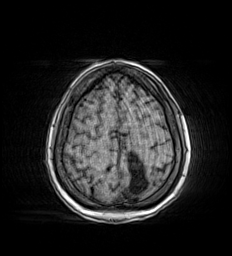
[im 138/176]
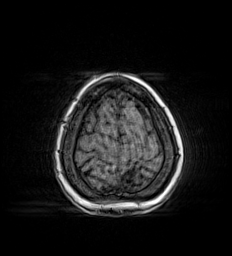
[im 151/176]
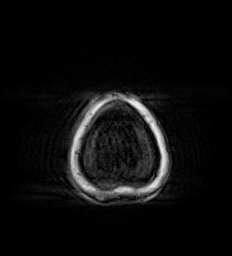
[im 163/176]
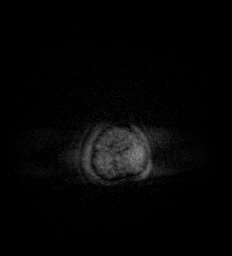
[im 176/176]
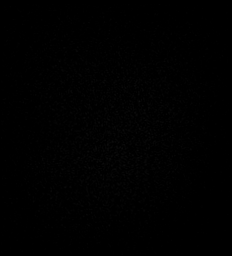

[Series 17: T2 · coronal · 5.0mm · 0.45mm/px · 3 of 31 slices shown (2 of 2)]
[im 1/31]
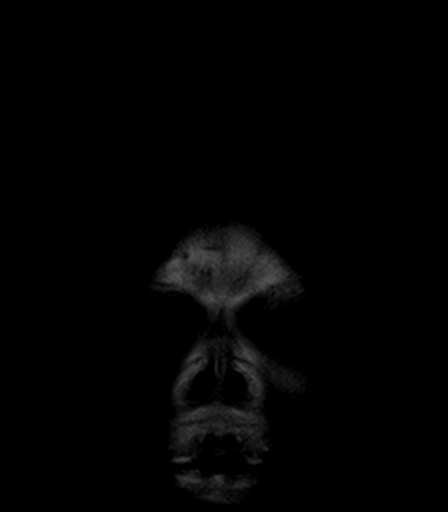
[im 16/31]
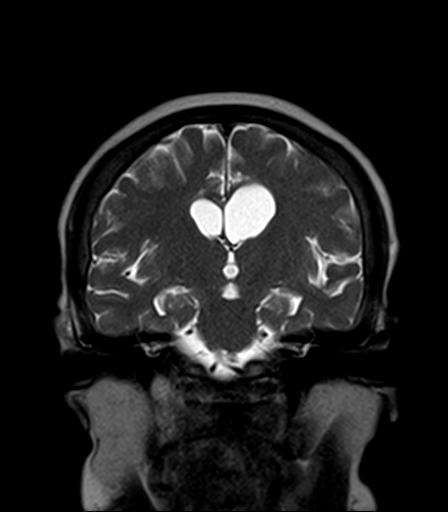
[im 31/31]
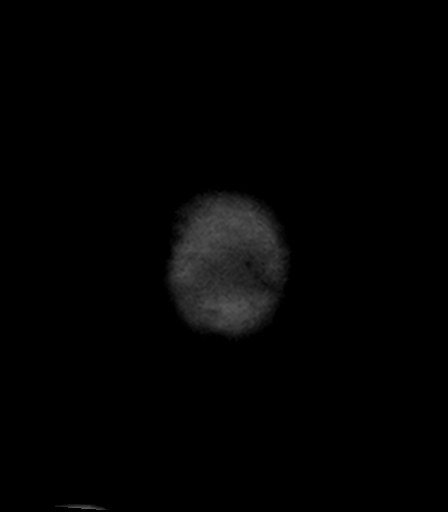

[48 of 48 positions shown; findings below may reference images not displayed]

FINDINGS: Brain: No restricted diffusion to suggest acute or subacute infarct.
No acute hemorrhage, mass, mass effect, or midline shift.
Encephalomalacia in the left parietal lobe and left posterior body
and splenium of the corpus callosum, likely sequela of remote
infarct, with ex vacuo dilatation of the left lateral ventricle. No
hydrocephalus or extra-axial collection. Lacunar infarct in the
posterior left midbrain.

Vascular: Normal arterial flow voids. Loss of the right distal
transverse and sigmoid sinus flow void.

Skull and upper cervical spine: Normal marrow signal.

Sinuses/Orbits: No acute finding. Status post left lens replacement.

Other: The mastoids are well aerated.
IMPRESSION: 1. No acute intracranial process. No evidence of acute or subacute
infarct.
2. Loss of the flow void in the right transverse and sigmoid sinus,
which could indicate slow flow but could also be seen in the setting
of venous sinus thrombosis. Correlate with symptoms and consider CT
venogram if clinically indicated.

These results were called by telephone at the time of interpretation
on 04/22/2022 at [DATE] to provider KAICHIU REZEKI , who verbally
acknowledged these results.

## 2023-12-09 ENCOUNTER — Other Ambulatory Visit: Payer: Self-pay | Admitting: Internal Medicine

## 2023-12-09 DIAGNOSIS — E1165 Type 2 diabetes mellitus with hyperglycemia: Secondary | ICD-10-CM

## 2023-12-13 ENCOUNTER — Other Ambulatory Visit: Payer: Self-pay | Admitting: Internal Medicine

## 2023-12-13 DIAGNOSIS — E1165 Type 2 diabetes mellitus with hyperglycemia: Secondary | ICD-10-CM

## 2023-12-13 MED ORDER — SITAGLIPTIN PHOSPHATE 100 MG PO TABS
100.0000 mg | ORAL_TABLET | Freq: Every day | ORAL | 1 refills | Status: DC
Start: 2023-12-13 — End: 2024-05-17

## 2024-01-05 ENCOUNTER — Encounter: Payer: Medicare PPO | Admitting: Internal Medicine

## 2024-01-10 ENCOUNTER — Ambulatory Visit: Payer: Medicare PPO | Admitting: Internal Medicine

## 2024-01-18 ENCOUNTER — Other Ambulatory Visit: Payer: Self-pay

## 2024-01-18 ENCOUNTER — Encounter: Payer: Self-pay | Admitting: Emergency Medicine

## 2024-01-18 ENCOUNTER — Emergency Department: Payer: Medicare PPO

## 2024-01-18 DIAGNOSIS — R0602 Shortness of breath: Secondary | ICD-10-CM | POA: Diagnosis not present

## 2024-01-18 DIAGNOSIS — J449 Chronic obstructive pulmonary disease, unspecified: Secondary | ICD-10-CM | POA: Insufficient documentation

## 2024-01-18 DIAGNOSIS — J101 Influenza due to other identified influenza virus with other respiratory manifestations: Secondary | ICD-10-CM | POA: Insufficient documentation

## 2024-01-18 DIAGNOSIS — R058 Other specified cough: Secondary | ICD-10-CM | POA: Diagnosis not present

## 2024-01-18 DIAGNOSIS — J45909 Unspecified asthma, uncomplicated: Secondary | ICD-10-CM | POA: Diagnosis not present

## 2024-01-18 DIAGNOSIS — R5381 Other malaise: Secondary | ICD-10-CM | POA: Diagnosis present

## 2024-01-18 DIAGNOSIS — I700 Atherosclerosis of aorta: Secondary | ICD-10-CM | POA: Diagnosis not present

## 2024-01-18 NOTE — ED Triage Notes (Signed)
Pt to triage via w/c with no distress noted; st around 1pm she put out "pool clorox" in her driveway in prep for ice; st she has had SHOB, prod cough clear sputum, N/V/D; hx COPD and asthma

## 2024-01-18 NOTE — ED Notes (Signed)
Pt taken to triage to do vital signs, pt stated she had to use the bathroom first. Pt taken to bathroom.

## 2024-01-19 ENCOUNTER — Emergency Department
Admission: EM | Admit: 2024-01-19 | Discharge: 2024-01-19 | Disposition: A | Discharge status: Home / Self Care | Payer: Medicare PPO | Attending: Emergency Medicine | Admitting: Emergency Medicine

## 2024-01-19 DIAGNOSIS — J101 Influenza due to other identified influenza virus with other respiratory manifestations: Secondary | ICD-10-CM

## 2024-01-19 LAB — CBC WITH DIFFERENTIAL/PLATELET
Abs Immature Granulocytes: 0.03 10*3/uL (ref 0.00–0.07)
Basophils Absolute: 0 10*3/uL (ref 0.0–0.1)
Basophils Relative: 0 %
Eosinophils Absolute: 0.1 10*3/uL (ref 0.0–0.5)
Eosinophils Relative: 1 %
HCT: 38.3 % (ref 36.0–46.0)
Hemoglobin: 12.3 g/dL (ref 12.0–15.0)
Immature Granulocytes: 0 %
Lymphocytes Relative: 8 %
Lymphs Abs: 0.6 10*3/uL — ABNORMAL LOW (ref 0.7–4.0)
MCH: 27.1 pg (ref 26.0–34.0)
MCHC: 32.1 g/dL (ref 30.0–36.0)
MCV: 84.4 fL (ref 80.0–100.0)
Monocytes Absolute: 0.9 10*3/uL (ref 0.1–1.0)
Monocytes Relative: 11 %
Neutro Abs: 6.5 10*3/uL (ref 1.7–7.7)
Neutrophils Relative %: 80 %
Platelets: 200 10*3/uL (ref 150–400)
RBC: 4.54 MIL/uL (ref 3.87–5.11)
RDW: 14 % (ref 11.5–15.5)
WBC: 8.2 10*3/uL (ref 4.0–10.5)
nRBC: 0 % (ref 0.0–0.2)

## 2024-01-19 LAB — COMPREHENSIVE METABOLIC PANEL
ALT: 20 U/L (ref 0–44)
AST: 24 U/L (ref 15–41)
Albumin: 4.3 g/dL (ref 3.5–5.0)
Alkaline Phosphatase: 64 U/L (ref 38–126)
Anion gap: 14 (ref 5–15)
BUN: 13 mg/dL (ref 8–23)
CO2: 23 mmol/L (ref 22–32)
Calcium: 9.2 mg/dL (ref 8.9–10.3)
Chloride: 101 mmol/L (ref 98–111)
Creatinine, Ser: 0.82 mg/dL (ref 0.44–1.00)
GFR, Estimated: >60 mL/min (ref >60)
Glucose, Bld: 297 mg/dL — ABNORMAL HIGH (ref 70–99)
Potassium: 3.9 mmol/L (ref 3.5–5.1)
Sodium: 138 mmol/L (ref 135–145)
Total Bilirubin: 0.8 mg/dL (ref 0.0–1.2)
Total Protein: 7.4 g/dL (ref 6.5–8.1)

## 2024-01-19 LAB — TROPONIN I (HIGH SENSITIVITY)
Troponin I (High Sensitivity): 18 ng/L — ABNORMAL HIGH (ref <18)
Troponin I (High Sensitivity): 19 ng/L — ABNORMAL HIGH (ref <18)

## 2024-01-19 LAB — LIPASE, BLOOD: Lipase: 36 U/L (ref 11–51)

## 2024-01-19 LAB — RESP PANEL BY RT-PCR (RSV, FLU A&B, COVID)  RVPGX2
Influenza A by PCR: POSITIVE — AB
Influenza B by PCR: NEGATIVE
Resp Syncytial Virus by PCR: NEGATIVE
SARS Coronavirus 2 by RT PCR: NEGATIVE

## 2024-01-19 MED ORDER — IPRATROPIUM-ALBUTEROL 0.5-2.5 (3) MG/3ML IN SOLN
3.0000 mL | Freq: Once | RESPIRATORY_TRACT | Status: AC
  Administered 2024-01-19: 3 mL via RESPIRATORY_TRACT
  Filled 2024-01-19: qty 3

## 2024-01-19 MED ORDER — BENZONATATE 100 MG PO CAPS
100.0000 mg | ORAL_CAPSULE | Freq: Once | ORAL | Status: AC
  Administered 2024-01-19: 100 mg via ORAL
  Filled 2024-01-19: qty 1

## 2024-01-19 MED ORDER — DEXAMETHASONE 10 MG/ML FOR PEDIATRIC ORAL USE
6.0000 mg | Freq: Once | INTRAMUSCULAR | Status: AC
  Administered 2024-01-19: 6 mg via ORAL
  Filled 2024-01-19: qty 1

## 2024-01-19 MED ORDER — HYDROCOD POLI-CHLORPHE POLI ER 10-8 MG/5ML PO SUER
5.0000 mL | Freq: Once | ORAL | Status: AC
  Administered 2024-01-19: 5 mL via ORAL
  Filled 2024-01-19: qty 5

## 2024-01-19 MED ORDER — BENZONATATE 100 MG PO CAPS: 100.0000 mg | ORAL_CAPSULE | Freq: Three times a day (TID) | ORAL | 0 refills | Status: DC | PRN

## 2024-01-19 MED ORDER — HYDROCOD POLI-CHLORPHE POLI ER 10-8 MG/5ML PO SUER: 5.0000 mL | Freq: Two times a day (BID) | ORAL | 0 refills | Status: DC | PRN

## 2024-01-19 NOTE — ED Notes (Signed)
Pt in lobby, hyperventilating, stating she can't breath, pt coughing a lot out in the lobby, pt states she has hx of COPD and Asthma. Pt working with clorox and did not know it. Pt able to calm herself down and was taken to the bathroom per her request.

## 2024-01-19 NOTE — ED Notes (Signed)
Pt ambulatory sats 96%
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
## 2024-01-19 NOTE — Discharge Instructions (Addendum)
 You were diagnosed with the flu (influenza).  You will feel ill for as much as a few weeks.  Please take any prescribed medications as instructed, and you may use over-the-counter Tylenol and/or ibuprofen as needed according to label instructions (unless you have an allergy to either or have been told by your doctor not to take them).  Please make sure to drink plenty of fluids and refer to the included information about rehydration.  Follow up with your physician as instructed above, and return to the Emergency Department (ED) if you are unable to tolerate fluids due to vomiting, have worsening trouble breathing, become extremely tired or difficult to awaken, or if you develop any other symptoms that concern you.

## 2024-01-24 DIAGNOSIS — J209 Acute bronchitis, unspecified: Secondary | ICD-10-CM | POA: Diagnosis not present

## 2024-01-24 DIAGNOSIS — R0602 Shortness of breath: Secondary | ICD-10-CM | POA: Diagnosis not present

## 2024-01-31 ENCOUNTER — Ambulatory Visit: Admitting: Family

## 2024-01-31 ENCOUNTER — Ambulatory Visit (INDEPENDENT_AMBULATORY_CARE_PROVIDER_SITE_OTHER)

## 2024-01-31 ENCOUNTER — Encounter: Payer: Self-pay | Admitting: Cardiology

## 2024-01-31 ENCOUNTER — Ambulatory Visit: Admitting: Cardiology

## 2024-01-31 VITALS — BP 122/76 | HR 113 | Ht 61.0 in | Wt 181.0 lb

## 2024-01-31 DIAGNOSIS — R051 Acute cough: Secondary | ICD-10-CM

## 2024-01-31 DIAGNOSIS — J449 Chronic obstructive pulmonary disease, unspecified: Secondary | ICD-10-CM | POA: Diagnosis not present

## 2024-01-31 DIAGNOSIS — J4530 Mild persistent asthma, uncomplicated: Secondary | ICD-10-CM

## 2024-01-31 DIAGNOSIS — R0989 Other specified symptoms and signs involving the circulatory and respiratory systems: Secondary | ICD-10-CM

## 2024-01-31 DIAGNOSIS — J069 Acute upper respiratory infection, unspecified: Secondary | ICD-10-CM

## 2024-01-31 DIAGNOSIS — R059 Cough, unspecified: Secondary | ICD-10-CM

## 2024-01-31 MED ORDER — AIRSUPRA 90-80 MCG/ACT IN AERO: 1.0000 | INHALATION_SPRAY | Freq: Four times a day (QID) | RESPIRATORY_TRACT | 1 refills | Status: AC | PRN

## 2024-01-31 MED ORDER — ALBUTEROL SULFATE (2.5 MG/3ML) 0.083% IN NEBU: 2.5000 mg | INHALATION_SOLUTION | RESPIRATORY_TRACT | 2 refills | Status: AC | PRN

## 2024-01-31 MED ORDER — ALBUTEROL SULFATE HFA 108 (90 BASE) MCG/ACT IN AERS: 2.0000 | INHALATION_SPRAY | Freq: Four times a day (QID) | RESPIRATORY_TRACT | 1 refills | Status: DC | PRN

## 2024-01-31 NOTE — Progress Notes (Unsigned)
 Established Patient Office Visit  Subjective:  Patient ID: Erica Camacho, female    DOB: 1951-08-14  Age: 73 y.o. MRN: [id number]  Chief Complaint  Patient presents with   Acute Visit    Cough, SOB Post Flu    Patient in office for an acute visit, complaining of cough, chest congestion. Patient seen in the ED on 12/2023, diagnosed with influenza A. Patient treated in ED with Decadron and a duoneb. Sent home with tussionex and Tessalon pearls. Patient went to [facility] urgent care, diagnosed with Bronchitis, started on Doxycyline and prednisone. Patient states she took the last Doxycyline today. Patient presents to office today short of breath, wheezing. Patient states she does not have any inhalers or nebulizer medication at home. Will refill inhalers and albuterol nebulizer solution. Chest xray today. Further treatment pending chest xray results.     No other concerns at this time.   Past Medical History:  Diagnosis Date   Anemia 02/04/2023   COPD (chronic obstructive pulmonary disease) (HCC)    Diabetes mellitus without complication (HCC)    Dyspnea    Heart murmur    Hyperlipidemia    Hypertension    Hypothyroidism    Thyroid disease    Vertigo    Wheezing    OCCAS    Past Surgical History:  Procedure Laterality Date   CATARACT EXTRACTION W/PHACO Left 10/05/2018   Procedure: CATARACT EXTRACTION PHACO AND INTRAOCULAR LENS PLACEMENT (IOC);  Surgeon: Elliot Cousin, MD;  Location: [facility] ORS;  Service: Ophthalmology;  Laterality: Left;  [place] 00:46.5 CDE 8.29 Fluid Pack Lot # [id number]   CESAREAN SECTION     X 3   FRACTURE SURGERY      Social History   Socioeconomic History   Marital status: Single    Spouse name: Not on file   Number of children: Not on file   Years of education: Not on file   Highest education level: Not on file  Occupational History   Not on file  Tobacco Use   Smoking status: Former   Smokeless tobacco: Never  Vaping Use   Vaping status: Never  Used  Substance and Sexual Activity   Alcohol use: No   Drug use: No   Sexual activity: Not on file  Other Topics Concern   Not on file  Social History Narrative   Not on file   Social Drivers of Health   Financial Resource Strain: Not on file  Food Insecurity: Not on file  Transportation Needs: Not on file  Physical Activity: Not on file  Stress: Not on file  Social Connections: Not on file  Intimate Partner Violence: Not on file    History reviewed. No pertinent family history.  Allergies  Allergen Reactions   Aspirin Hives and Other (See Comments)    Allergic to aspirin that is not coated. Hives and GI upset   Ampicillin Hives   Carrot [Daucus Carota] Hives   Codeine Hives   Latex Itching   Penicillins Hives and Other (See Comments)    Has patient had a PCN reaction causing immediate rash, facial/tongue/throat swelling, SOB or lightheadedness with hypotension: No Has patient had a PCN reaction causing severe rash involving mucus membranes or skin necrosis: Yes Has patient had a PCN reaction that required hospitalization: Yes Has patient had a PCN reaction occurring within the last 10 years: No If all of the above answers are "NO", then may proceed with Cephalosporin use.     Outpatient Medications Prior to  Visit  Medication Sig   Accu-Chek FastClix Lancets MISC USE ONCE A DAY AS NEEDED   acetaminophen (TYLENOL) 500 MG tablet Take 500 mg by mouth daily as needed for moderate pain or headache.   amLODipine (NORVASC) 10 MG tablet Take 10 mg by mouth daily.   aspirin EC 81 MG tablet Take 81 mg by mouth daily.   atorvastatin (LIPITOR) 40 MG tablet TAKE 1 TABLET BY MOUTH EVERY DAY   Cholecalciferol (VITAMIN D3) 1.25 MG (50000 UT) CAPS Take 1 capsule by mouth every 7 (seven) days.    dapagliflozin propanediol (FARXIGA) 10 MG TABS tablet Take 1 tablet (10 mg total) by mouth daily before breakfast.   diltiazem (CARDIZEM LA) 120 MG 24 hr tablet Take 1 tablet (120 mg total)  by mouth daily.   famotidine (PEPCID) 20 MG tablet Take 1 tablet (20 mg total) by mouth 2 (two) times daily.   fluticasone (FLONASE) 50 MCG/ACT nasal spray Place 1 spray into both nostrils daily.   glimepiride (AMARYL) 4 MG tablet Take 1 tablet (4 mg total) by mouth 2 (two) times daily.   glucose blood (ACCU-CHEK GUIDE) test strip Use as instructed to check glucose up to twice daily   ivabradine (CORLANOR) 5 MG TABS tablet Take tablets (15mg ) TWO hours prior to your cardiac CT Scan.   levothyroxine (SYNTHROID) 137 MCG tablet TAKE 1 TABLET BY MOUTH EVERYDAY IN THE MORNING ON AN EMPTY STOMACH, NO OTHER MEDS OR FOODS FOR AN HOUR   losartan (COZAAR) 100 MG tablet Take 1 tablet (100 mg total) by mouth daily.   meclizine (ANTIVERT) 25 MG tablet Take 1 tablet (25 mg total) by mouth every 6 (six) hours as needed for dizziness or nausea.   metoprolol tartrate (LOPRESSOR) 100 MG tablet Take 1 tablet (100 mg total) by mouth once for 1 dose. Take two hours prior to your CT.   Multiple Vitamin (MULTI-VITAMIN) tablet Take 1 tablet by mouth every other day.   Multiple Vitamins-Minerals (EMERGEN-C IMMUNE) PACK Take 1 packet by mouth daily as needed (immune support).   pioglitazone (ACTOS) 30 MG tablet Take 30 mg by mouth daily.   Semaglutide,0.25 or 0.5MG /DOS, 2 MG/3ML SOPN Inject 0.25 mg into the skin once a week.   sitaGLIPtin (JANUVIA) 100 MG tablet Take 1 tablet (100 mg total) by mouth daily.   Tetrahydrozoline HCl (VISINE OP) Place 1 drop into both eyes daily as needed (dry eyes).   triamterene-hydrochlorothiazide (MAXZIDE-25) 37.5-25 MG tablet Take 1 tablet by mouth daily.   [DISCONTINUED] albuterol (VENTOLIN HFA) 108 (90 Base) MCG/ACT inhaler INHALE 2 PUFFS INTO THE LUNGS EVERY 6 HOURS AS NEEDED FOR WHEEZING OR SHORTNESS OF BREATH   [DISCONTINUED] Albuterol-Budesonide (AIRSUPRA) 90-80 MCG/ACT AERO Inhale 1 puff into the lungs every 6 (six) hours as needed.   [DISCONTINUED] benzonatate (TESSALON PERLES) 100  MG capsule Take 1 capsule (100 mg total) by mouth 3 (three) times daily as needed for cough.   [DISCONTINUED] chlorpheniramine-HYDROcodone (TUSSIONEX) 10-8 MG/5ML Take 5 mLs by mouth every 12 (twelve) hours as needed for cough.   [DISCONTINUED] fluconazole (DIFLUCAN) 150 MG tablet TAKE 1 TABLET(150 MG) BY MOUTH DAILY FOR 3 DAYS   [DISCONTINUED] Na Sulfate-K Sulfate-Mg Sulf (SUPREP BOWEL PREP KIT) 17.5-3.13-1.6 GM/177ML SOLN Take 1 kit by mouth as directed.   No facility-administered medications prior to visit.    Review of Systems  Constitutional: Negative.   HENT: Negative.    Eyes: Negative.   Respiratory:  Positive for cough, sputum production, shortness of breath  and wheezing.   Cardiovascular: Negative.  Negative for chest pain.  Gastrointestinal: Negative.  Negative for abdominal pain, constipation and diarrhea.  Genitourinary: Negative.   Musculoskeletal:  Negative for joint pain and myalgias.  Skin: Negative.   Neurological: Negative.  Negative for dizziness and headaches.  Endo/Heme/Allergies: Negative.   All other systems reviewed and are negative.      Objective:   BP 122/76   Pulse (!) 113   Ht 5\' 1"  (1.549 m)   Wt 181 lb (82.1 kg)   SpO2 96%   BMI 34.20 kg/m   Vitals:   01/31/24 1310  BP: 122/76  Pulse: (!) 113  Height: 5\' 1"  (1.549 m)  Weight: 181 lb (82.1 kg)  SpO2: 96%  BMI (Calculated): 34.22    Physical Exam Vitals and nursing note reviewed.  Constitutional:      Appearance: Normal appearance. She is normal weight.  HENT:     Head: Normocephalic and atraumatic.     Nose: Nose normal.     Mouth/Throat:     Mouth: Mucous membranes are moist.  Eyes:     Extraocular Movements: Extraocular movements intact.     Conjunctiva/sclera: Conjunctivae normal.     Pupils: Pupils are equal, round, and reactive to light.  Cardiovascular:     Rate and Rhythm: Normal rate and regular rhythm.     Pulses: Normal pulses.     Heart sounds: Normal heart sounds.   Pulmonary:     Effort: Pulmonary effort is normal. No respiratory distress.     Breath sounds: No stridor. Wheezing present. No rhonchi or rales.  Abdominal:     General: Abdomen is flat. Bowel sounds are normal.     Palpations: Abdomen is soft.  Musculoskeletal:        General: Normal range of motion.     Cervical back: Normal range of motion.  Skin:    General: Skin is warm and dry.  Neurological:     General: No focal deficit present.     Mental Status: She is alert and oriented to person, place, and time.  Psychiatric:        Mood and Affect: Mood normal.        Behavior: Behavior normal.        Thought Content: Thought content normal.        Judgment: Judgment normal.      No results found for any visits on 01/31/24.  Recent Results (from the past 2160 hours)  CBC with Differential     Status: Abnormal   Collection Time: 01/18/24 11:18 PM  Result Value Ref Range   WBC 8.2 4.0 - 10.5 K/uL   RBC 4.54 3.87 - 5.11 MIL/uL   Hemoglobin 12.3 12.0 - 15.0 g/dL   HCT 16.1 09.6 - 04.5 %   MCV 84.4 80.0 - 100.0 fL   MCH 27.1 26.0 - 34.0 pg   MCHC 32.1 30.0 - 36.0 g/dL   RDW 40.9 81.1 - 91.4 %   Platelets 200 150 - 400 K/uL   nRBC 0.0 0.0 - 0.2 %   Neutrophils Relative % 80 %   Neutro Abs 6.5 1.7 - 7.7 K/uL   Lymphocytes Relative 8 %   Lymphs Abs 0.6 (L) 0.7 - 4.0 K/uL   Monocytes Relative 11 %   Monocytes Absolute 0.9 0.1 - 1.0 K/uL   Eosinophils Relative 1 %   Eosinophils Absolute 0.1 0.0 - 0.5 K/uL   Basophils Relative 0 %   Basophils  Absolute 0.0 0.0 - 0.1 K/uL   Immature Granulocytes 0 %   Abs Immature Granulocytes 0.03 0.00 - 0.07 K/uL    Comment: Performed at [facility], [address] [place], [place] [address]  Comprehensive metabolic panel     Status: Abnormal   Collection Time: 01/18/24 11:18 PM  Result Value Ref Range   Sodium 138 135 - 145 mmol/L   Potassium 3.9 3.5 - 5.1 mmol/L   Chloride 101 98 - 111 mmol/L   CO2 23 22 - 32 mmol/L    Glucose, Bld 297 (H) 70 - 99 mg/dL    Comment: Glucose reference range applies only to samples taken after fasting for at least 8 hours.   BUN 13 8 - 23 mg/dL   Creatinine, Ser 2.13 0.44 - 1.00 mg/dL   Calcium 9.2 8.9 - 08.6 mg/dL   Total Protein 7.4 6.5 - 8.1 g/dL   Albumin 4.3 3.5 - 5.0 g/dL   AST 24 15 - 41 U/L   ALT 20 0 - 44 U/L   Alkaline Phosphatase 64 38 - 126 U/L   Total Bilirubin 0.8 0.0 - 1.2 mg/dL   GFR, Estimated >57 >84 mL/min    Comment: (NOTE) Calculated using the CKD-EPI Creatinine Equation (2021)    Anion gap 14 5 - 15    Comment: Performed at [facility], [address] [place], [place] [address]  Lipase, blood     Status: None   Collection Time: 01/18/24 11:18 PM  Result Value Ref Range   Lipase 36 11 - 51 U/L    Comment: Performed at [facility], [address] [place], [place] [address]  Troponin I (High Sensitivity)     Status: Abnormal   Collection Time: 01/18/24 11:18 PM  Result Value Ref Range   Troponin I (High Sensitivity) 18 (H) <18 ng/L    Comment: (NOTE) Elevated high sensitivity troponin I (hsTnI) values and significant  changes across serial measurements may suggest ACS but many other  chronic and acute conditions are known to elevate hsTnI results.  Refer to the "Links" section for chest pain algorithms and additional  guidance. Performed at [facility], [address] [place], [place] [address]   Resp panel by RT-PCR (RSV, Flu A&B, Covid) Anterior Nasal Swab     Status: Abnormal   Collection Time: 01/18/24 11:18 PM   Specimen: Anterior Nasal Swab  Result Value Ref Range   SARS Coronavirus 2 by RT PCR NEGATIVE NEGATIVE    Comment: (NOTE) SARS-CoV-2 target nucleic acids are NOT DETECTED.  The SARS-CoV-2 RNA is generally detectable in upper respiratory specimens during the acute phase of infection. The lowest concentration of SARS-CoV-2 viral copies this assay can detect is 138 copies/mL. A negative  result does not preclude SARS-Cov-2 infection and should not be used as the sole basis for treatment or other patient management decisions. A negative result may occur with  improper specimen collection/handling, submission of specimen other than nasopharyngeal swab, presence of viral mutation(s) within the areas targeted by this assay, and inadequate number of viral copies(<138 copies/mL). A negative result must be combined with clinical observations, patient history, and epidemiological information. The expected result is Negative.  Fact Sheet for Patients:  BloggerCourse.com  Fact Sheet for Healthcare Providers:  SeriousBroker.it  This test is no t yet approved or cleared by the [place] FDA and  has been authorized for detection and/or diagnosis of SARS-CoV-2 by FDA under an Emergency Use Authorization (EUA).  This EUA will remain  in effect (meaning this test can be used) for the duration of the COVID-19 declaration under Section 564(b)(1) of the Act, 21 U.S.C.section 360bbb-3(b)(1), unless the authorization is terminated  or revoked sooner.       Influenza A by PCR POSITIVE (A) NEGATIVE   Influenza B by PCR NEGATIVE NEGATIVE    Comment: (NOTE) The Xpert Xpress SARS-CoV-2/FLU/RSV plus assay is intended as an aid in the diagnosis of influenza from Nasopharyngeal swab specimens and should not be used as a sole basis for treatment. Nasal washings and aspirates are unacceptable for Xpert Xpress SARS-CoV-2/FLU/RSV testing.  Fact Sheet for Patients: BloggerCourse.com  Fact Sheet for Healthcare Providers: SeriousBroker.it  This test is not yet approved or cleared by the [place] FDA and has been authorized for detection and/or diagnosis of SARS-CoV-2 by FDA under an Emergency Use Authorization (EUA). This EUA will remain in effect (meaning this test can be used) for the  duration of the COVID-19 declaration under Section 564(b)(1) of the Act, 21 U.S.C. section 360bbb-3(b)(1), unless the authorization is terminated or revoked.     Resp Syncytial Virus by PCR NEGATIVE NEGATIVE    Comment: (NOTE) Fact Sheet for Patients: BloggerCourse.com  Fact Sheet for Healthcare Providers: SeriousBroker.it  This test is not yet approved or cleared by the [place] FDA and has been authorized for detection and/or diagnosis of SARS-CoV-2 by FDA under an Emergency Use Authorization (EUA). This EUA will remain in effect (meaning this test can be used) for the duration of the COVID-19 declaration under Section 564(b)(1) of the Act, 21 U.S.C. section 360bbb-3(b)(1), unless the authorization is terminated or revoked.  Performed at [facility], [address] [place], [place] [address]   Troponin I (High Sensitivity)     Status: Abnormal   Collection Time: 01/19/24  1:43 AM  Result Value Ref Range   Troponin I (High Sensitivity) 19 (H) <18 ng/L    Comment: (NOTE) Elevated high sensitivity troponin I (hsTnI) values and significant  changes across serial measurements may suggest ACS but many other  chronic and acute conditions are known to elevate hsTnI results.  Refer to the "Links" section for chest pain algorithms and additional  guidance. Performed at [facility], [address], [place], [place] [address]       Assessment & Plan:  Chest xray today Inhalers and nebulizer solution refilled.  Problem List Items Addressed This Visit       Respiratory   COPD (chronic obstructive pulmonary disease) (HCC) - Primary   Relevant Medications   Albuterol-Budesonide (AIRSUPRA) 90-80 MCG/ACT AERO   albuterol (PROVENTIL) (2.5 MG/3ML) 0.083% nebulizer solution   Other Relevant Orders   DG Chest 2 View   Other Visit Diagnoses       Cough, unspecified type       Relevant Orders   DG Chest  2 View     Mild persistent asthma without complication       Relevant Medications   Albuterol-Budesonide (AIRSUPRA) 90-80 MCG/ACT AERO   albuterol (PROVENTIL) (2.5 MG/3ML) 0.083% nebulizer solution       Return in about 1 week (around 02/07/2024) for with NK.   Total time spent: 25 minutes  Google, NP  01/31/2024   This document may have been prepared by Dragon Voice Recognition software and as such may include unintentional dictation errors.

## 2024-02-06 ENCOUNTER — Ambulatory Visit: Admitting: Internal Medicine

## 2024-02-06 NOTE — Progress Notes (Signed)
 Patient informed.

## 2024-03-08 ENCOUNTER — Ambulatory Visit: Admitting: Internal Medicine

## 2024-03-13 ENCOUNTER — Encounter: Payer: Self-pay | Admitting: Internal Medicine

## 2024-03-13 ENCOUNTER — Ambulatory Visit: Admitting: Internal Medicine

## 2024-03-13 ENCOUNTER — Telehealth: Payer: Self-pay | Admitting: Internal Medicine

## 2024-03-13 VITALS — BP 170/100 | HR 97 | Ht 61.0 in | Wt 189.0 lb

## 2024-03-13 DIAGNOSIS — E782 Mixed hyperlipidemia: Secondary | ICD-10-CM

## 2024-03-13 DIAGNOSIS — E1159 Type 2 diabetes mellitus with other circulatory complications: Secondary | ICD-10-CM | POA: Diagnosis not present

## 2024-03-13 DIAGNOSIS — J449 Chronic obstructive pulmonary disease, unspecified: Secondary | ICD-10-CM | POA: Diagnosis not present

## 2024-03-13 DIAGNOSIS — E039 Hypothyroidism, unspecified: Secondary | ICD-10-CM | POA: Diagnosis not present

## 2024-03-13 DIAGNOSIS — J4530 Mild persistent asthma, uncomplicated: Secondary | ICD-10-CM | POA: Diagnosis not present

## 2024-03-13 DIAGNOSIS — E1165 Type 2 diabetes mellitus with hyperglycemia: Secondary | ICD-10-CM | POA: Diagnosis not present

## 2024-03-13 DIAGNOSIS — I152 Hypertension secondary to endocrine disorders: Secondary | ICD-10-CM

## 2024-03-13 DIAGNOSIS — E1169 Type 2 diabetes mellitus with other specified complication: Secondary | ICD-10-CM | POA: Diagnosis not present

## 2024-03-13 LAB — POCT CBG (FASTING - GLUCOSE)-MANUAL ENTRY: Glucose Fasting, POC: 253 mg/dL — AB (ref 70–99)

## 2024-03-13 LAB — POC CREATINE & ALBUMIN,URINE
Albumin/Creatinine Ratio, Urine, POC: >300
Creatinine, POC: 200 mg/dL
Microalbumin Ur, POC: 150 mg/L

## 2024-03-13 MED ORDER — AMLODIPINE BESYLATE 10 MG PO TABS: 10.0000 mg | ORAL_TABLET | Freq: Every day | ORAL | 11 refills | Status: DC

## 2024-03-13 NOTE — Telephone Encounter (Signed)
 Patient left VM that she picked up the BP meds from the pharmacy but she thought Dr. Meredeth Stallion was going to send her another medication for her diabetes. I do not see that any diabetes meds were sent in. Were you going to wait until she comes back to add another med for diabetes?

## 2024-03-13 NOTE — Progress Notes (Signed)
 Established Patient Office Visit  Subjective:  Patient ID: Erica Camacho, female    DOB: 1951-10-23  Age: 73 y.o. MRN: [id number]  Chief Complaint  Patient presents with   Follow-up    1 week follow up    Patient comes in for follow-up of her chronic medical conditions.  Earlier this year she was here for treatment of acute cough due to influenza.  Her last hemoglobin A1c which was done in June 2024 was 9.0.  Her medications were adjusted and the patient did not show up until today.  She ran out of some of the medications.  Also reports that her insurance never covered [place] or Ozempic.  Her fingerstick today is also very high.  As well as her blood pressure.  She is missing metoprolol and Norvasc from her medication bag.  Denies headaches or dizziness, no chest pain and no shortness of breath.  Will check her blood work today.  Meds refilled.  Patient will return in 1 week for further adjusting her medications. She missed her eye exam as well as mammogram.    No other concerns at this time.   Past Medical History:  Diagnosis Date   Anemia 02/04/2023   COPD (chronic obstructive pulmonary disease) (HCC)    Diabetes mellitus without complication (HCC)    Dyspnea    Heart murmur    Hyperlipidemia    Hypertension    Hypothyroidism    Thyroid disease    Vertigo    Wheezing    OCCAS    Past Surgical History:  Procedure Laterality Date   CATARACT EXTRACTION W/PHACO Left 10/05/2018   Procedure: CATARACT EXTRACTION PHACO AND INTRAOCULAR LENS PLACEMENT (IOC);  Surgeon: Elliot Cousin, MD;  Location: [facility] ORS;  Service: Ophthalmology;  Laterality: Left;  [place] 00:46.5 CDE 8.29 Fluid Pack Lot # [id number]   CESAREAN SECTION     X 3   FRACTURE SURGERY      Social History   Socioeconomic History   Marital status: Single    Spouse name: Not on file   Number of children: Not on file   Years of education: Not on file   Highest education level: Not on file  Occupational History   Not  on file  Tobacco Use   Smoking status: Former   Smokeless tobacco: Never  Vaping Use   Vaping status: Never Used  Substance and Sexual Activity   Alcohol use: No   Drug use: No   Sexual activity: Not on file  Other Topics Concern   Not on file  Social History Narrative   Not on file   Social Drivers of Health   Financial Resource Strain: Not on file  Food Insecurity: Not on file  Transportation Needs: Not on file  Physical Activity: Not on file  Stress: Not on file  Social Connections: Not on file  Intimate Partner Violence: Not on file    History reviewed. No pertinent family history.  Allergies  Allergen Reactions   Aspirin Hives and Other (See Comments)    Allergic to aspirin that is not coated. Hives and GI upset   Ampicillin Hives   Carrot [Daucus Carota] Hives   Codeine Hives   Latex Itching   Penicillins Hives and Other (See Comments)    Has patient had a PCN reaction causing immediate rash, facial/tongue/throat swelling, SOB or lightheadedness with hypotension: No Has patient had a PCN reaction causing severe rash involving mucus membranes or skin necrosis: Yes Has patient had a  PCN reaction that required hospitalization: Yes Has patient had a PCN reaction occurring within the last 10 years: No If all of the above answers are "NO", then may proceed with Cephalosporin use.     Outpatient Medications Prior to Visit  Medication Sig   Accu-Chek FastClix Lancets MISC USE ONCE A DAY AS NEEDED   acetaminophen (TYLENOL) 500 MG tablet Take 500 mg by mouth daily as needed for moderate pain or headache.   albuterol (PROVENTIL) (2.5 MG/3ML) 0.083% nebulizer solution Take 3 mLs (2.5 mg total) by nebulization every 4 (four) hours as needed for wheezing or shortness of breath.   aspirin EC 81 MG tablet Take 81 mg by mouth daily.   atorvastatin (LIPITOR) 40 MG tablet TAKE 1 TABLET BY MOUTH EVERY DAY   diltiazem (CARDIZEM LA) 120 MG 24 hr tablet Take 1 tablet (120 mg total)  by mouth daily.   glimepiride (AMARYL) 4 MG tablet Take 1 tablet (4 mg total) by mouth 2 (two) times daily.   glucose blood (ACCU-CHEK GUIDE) test strip Use as instructed to check glucose up to twice daily   levothyroxine (SYNTHROID) 137 MCG tablet TAKE 1 TABLET BY MOUTH EVERYDAY IN THE MORNING ON AN EMPTY STOMACH, NO OTHER MEDS OR FOODS FOR AN HOUR   losartan (COZAAR) 100 MG tablet Take 1 tablet (100 mg total) by mouth daily.   Multiple Vitamin (MULTI-VITAMIN) tablet Take 1 tablet by mouth every other day.   sitaGLIPtin (JANUVIA) 100 MG tablet Take 1 tablet (100 mg total) by mouth daily.   Albuterol-Budesonide (AIRSUPRA) 90-80 MCG/ACT AERO Inhale 1 puff into the lungs every 6 (six) hours as needed. (Patient not taking: Reported on 03/13/2024)   amLODipine (NORVASC) 10 MG tablet Take 10 mg by mouth daily. (Patient not taking: Reported on 03/13/2024)   Cholecalciferol (VITAMIN D3) 1.25 MG (50000 UT) CAPS Take 1 capsule by mouth every 7 (seven) days.  (Patient not taking: Reported on 03/13/2024)   dapagliflozin propanediol (FARXIGA) 10 MG TABS tablet Take 1 tablet (10 mg total) by mouth daily before breakfast. (Patient not taking: Reported on 03/13/2024)   famotidine (PEPCID) 20 MG tablet Take 1 tablet (20 mg total) by mouth 2 (two) times daily. (Patient not taking: Reported on 03/13/2024)   fluticasone (FLONASE) 50 MCG/ACT nasal spray Place 1 spray into both nostrils daily. (Patient not taking: Reported on 03/13/2024)   ivabradine (CORLANOR) 5 MG TABS tablet Take tablets (15mg ) TWO hours prior to your cardiac CT Scan. (Patient not taking: Reported on 03/13/2024)   meclizine (ANTIVERT) 25 MG tablet Take 1 tablet (25 mg total) by mouth every 6 (six) hours as needed for dizziness or nausea. (Patient not taking: Reported on 03/13/2024)   metoprolol tartrate (LOPRESSOR) 100 MG tablet Take 1 tablet (100 mg total) by mouth once for 1 dose. Take two hours prior to your CT. (Patient not taking: Reported on 03/13/2024)    Multiple Vitamins-Minerals (EMERGEN-C IMMUNE) PACK Take 1 packet by mouth daily as needed (immune support). (Patient not taking: Reported on 03/13/2024)   pioglitazone (ACTOS) 30 MG tablet Take 30 mg by mouth daily. (Patient not taking: Reported on 03/13/2024)   Semaglutide,0.25 or 0.5MG /DOS, 2 MG/3ML SOPN Inject 0.25 mg into the skin once a week. (Patient not taking: Reported on 03/13/2024)   Tetrahydrozoline HCl (VISINE OP) Place 1 drop into both eyes daily as needed (dry eyes). (Patient not taking: Reported on 03/13/2024)   triamterene-hydrochlorothiazide (MAXZIDE-25) 37.5-25 MG tablet Take 1 tablet by mouth daily. (Patient not taking:  Reported on 03/13/2024)   No facility-administered medications prior to visit.    Review of Systems  Constitutional: Negative.  Negative for chills, fever, malaise/fatigue and weight loss.  HENT: Negative.  Negative for congestion and sore throat.   Eyes: Negative.   Respiratory: Negative.  Negative for cough and shortness of breath.   Cardiovascular: Negative.  Negative for chest pain, palpitations and leg swelling.  Gastrointestinal: Negative.  Negative for abdominal pain, constipation, diarrhea, heartburn, nausea and vomiting.  Genitourinary: Negative.  Negative for dysuria and flank pain.  Musculoskeletal: Negative.  Negative for joint pain and myalgias.  Skin: Negative.   Neurological: Negative.  Negative for dizziness, tingling, sensory change and headaches.  Endo/Heme/Allergies: Negative.   Psychiatric/Behavioral: Negative.  Negative for depression and suicidal ideas. The patient is not nervous/anxious.        Objective:   BP (!) 170/100   Pulse 97   Ht 5\' 1"  (1.549 m)   Wt 189 lb (85.7 kg)   SpO2 97%   BMI 35.71 kg/m   Vitals:   03/13/24 1101  BP: (!) 170/100  Pulse: 97  Height: 5\' 1"  (1.549 m)  Weight: 189 lb (85.7 kg)  SpO2: 97%  BMI (Calculated): 35.73    Physical Exam Vitals and nursing note reviewed.  Constitutional:       Appearance: Normal appearance.  HENT:     Head: Normocephalic and atraumatic.     Nose: Nose normal.     Mouth/Throat:     Mouth: Mucous membranes are moist.     Pharynx: Oropharynx is clear.  Eyes:     Conjunctiva/sclera: Conjunctivae normal.     Pupils: Pupils are equal, round, and reactive to light.  Cardiovascular:     Rate and Rhythm: Normal rate and regular rhythm.     Pulses: Normal pulses.     Heart sounds: Normal heart sounds. No murmur heard. Pulmonary:     Effort: Pulmonary effort is normal.     Breath sounds: Normal breath sounds. No wheezing.  Abdominal:     General: Bowel sounds are normal.     Palpations: Abdomen is soft.     Tenderness: There is no abdominal tenderness. There is no right CVA tenderness or left CVA tenderness.  Musculoskeletal:        General: Normal range of motion.     Cervical back: Normal range of motion.     Right lower leg: No edema.     Left lower leg: No edema.  Skin:    General: Skin is warm and dry.  Neurological:     General: No focal deficit present.     Mental Status: She is alert and oriented to person, place, and time.  Psychiatric:        Mood and Affect: Mood normal.        Behavior: Behavior normal.      Results for orders placed or performed in visit on 03/13/24  POCT CBG (Fasting - Glucose)  Result Value Ref Range   Glucose Fasting, POC 253 (A) 70 - 99 mg/dL  POC CREATINE & ALBUMIN,URINE  Result Value Ref Range   Microalbumin Ur, POC 150 mg/L   Creatinine, POC 200 mg/dL   Albumin/Creatinine Ratio, Urine, POC >300     Recent Results (from the past 2160 hours)  CBC with Differential     Status: Abnormal   Collection Time: 01/18/24 11:18 PM  Result Value Ref Range   WBC 8.2 4.0 - 10.5 K/uL   RBC  4.54 3.87 - 5.11 MIL/uL   Hemoglobin 12.3 12.0 - 15.0 g/dL   HCT 53.6 64.4 - 03.4 %   MCV 84.4 80.0 - 100.0 fL   MCH 27.1 26.0 - 34.0 pg   MCHC 32.1 30.0 - 36.0 g/dL   RDW 74.2 59.5 - 63.8 %   Platelets 200 150 - 400  K/uL   nRBC 0.0 0.0 - 0.2 %   Neutrophils Relative % 80 %   Neutro Abs 6.5 1.7 - 7.7 K/uL   Lymphocytes Relative 8 %   Lymphs Abs 0.6 (L) 0.7 - 4.0 K/uL   Monocytes Relative 11 %   Monocytes Absolute 0.9 0.1 - 1.0 K/uL   Eosinophils Relative 1 %   Eosinophils Absolute 0.1 0.0 - 0.5 K/uL   Basophils Relative 0 %   Basophils Absolute 0.0 0.0 - 0.1 K/uL   Immature Granulocytes 0 %   Abs Immature Granulocytes 0.03 0.00 - 0.07 K/uL    Comment: Performed at [facility], [address] [place], [place] [address]  Comprehensive metabolic panel     Status: Abnormal   Collection Time: 01/18/24 11:18 PM  Result Value Ref Range   Sodium 138 135 - 145 mmol/L   Potassium 3.9 3.5 - 5.1 mmol/L   Chloride 101 98 - 111 mmol/L   CO2 23 22 - 32 mmol/L   Glucose, Bld 297 (H) 70 - 99 mg/dL    Comment: Glucose reference range applies only to samples taken after fasting for at least 8 hours.   BUN 13 8 - 23 mg/dL   Creatinine, Ser 3.29 0.44 - 1.00 mg/dL   Calcium 9.2 8.9 - 51.8 mg/dL   Total Protein 7.4 6.5 - 8.1 g/dL   Albumin 4.3 3.5 - 5.0 g/dL   AST 24 15 - 41 U/L   ALT 20 0 - 44 U/L   Alkaline Phosphatase 64 38 - 126 U/L   Total Bilirubin 0.8 0.0 - 1.2 mg/dL   GFR, Estimated >84 >16 mL/min    Comment: (NOTE) Calculated using the CKD-EPI Creatinine Equation (2021)    Anion gap 14 5 - 15    Comment: Performed at [facility], [address] [place], [place] [address]  Lipase, blood     Status: None   Collection Time: 01/18/24 11:18 PM  Result Value Ref Range   Lipase 36 11 - 51 U/L    Comment: Performed at [facility], [address] [place], [place] [address]  Troponin I (High Sensitivity)     Status: Abnormal   Collection Time: 01/18/24 11:18 PM  Result Value Ref Range   Troponin I (High Sensitivity) 18 (H) <18 ng/L    Comment: (NOTE) Elevated high sensitivity troponin I (hsTnI) values and significant  changes across serial measurements may suggest ACS  but many other  chronic and acute conditions are known to elevate hsTnI results.  Refer to the "Links" section for chest pain algorithms and additional  guidance. Performed at [facility], [address] [place], [place] [address]   Resp panel by RT-PCR (RSV, Flu A&B, Covid) Anterior Nasal Swab     Status: Abnormal   Collection Time: 01/18/24 11:18 PM   Specimen: Anterior Nasal Swab  Result Value Ref Range   SARS Coronavirus 2 by RT PCR NEGATIVE NEGATIVE    Comment: (NOTE) SARS-CoV-2 target nucleic acids are NOT DETECTED.  The SARS-CoV-2 RNA is generally detectable in upper respiratory specimens during the acute phase of infection. The  lowest concentration of SARS-CoV-2 viral copies this assay can detect is 138 copies/mL. A negative result does not preclude SARS-Cov-2 infection and should not be used as the sole basis for treatment or other patient management decisions. A negative result may occur with  improper specimen collection/handling, submission of specimen other than nasopharyngeal swab, presence of viral mutation(s) within the areas targeted by this assay, and inadequate number of viral copies(<138 copies/mL). A negative result must be combined with clinical observations, patient history, and epidemiological information. The expected result is Negative.  Fact Sheet for Patients:  BloggerCourse.com  Fact Sheet for Healthcare Providers:  SeriousBroker.it  This test is no t yet approved or cleared by the [place]  FDA and  has been authorized for detection and/or diagnosis of SARS-CoV-2 by FDA under an Emergency Use Authorization (EUA). This EUA will remain  in effect (meaning this test can be used) for the duration of the COVID-19 declaration under Section 564(b)(1) of the Act, 21 U.S.C.section 360bbb-3(b)(1), unless the authorization is terminated  or revoked sooner.       Influenza A by PCR POSITIVE  (A) NEGATIVE   Influenza B by PCR NEGATIVE NEGATIVE    Comment: (NOTE) The Xpert Xpress SARS-CoV-2/FLU/RSV plus assay is intended as an aid in the diagnosis of influenza from Nasopharyngeal swab specimens and should not be used as a sole basis for treatment. Nasal washings and aspirates are unacceptable for Xpert Xpress SARS-CoV-2/FLU/RSV testing.  Fact Sheet for Patients: BloggerCourse.com  Fact Sheet for Healthcare Providers: SeriousBroker.it  This test is not yet approved or cleared by the [place]  FDA and has been authorized for detection and/or diagnosis of SARS-CoV-2 by FDA under an Emergency Use Authorization (EUA). This EUA will remain in effect (meaning this test can be used) for the duration of the COVID-19 declaration under Section 564(b)(1) of the Act, 21 U.S.C. section 360bbb-3(b)(1), unless the authorization is terminated or revoked.     Resp Syncytial Virus by PCR NEGATIVE NEGATIVE    Comment: (NOTE) Fact Sheet for Patients: BloggerCourse.com  Fact Sheet for Healthcare Providers: SeriousBroker.it  This test is not yet approved or cleared by the [place]  FDA and has been authorized for detection and/or diagnosis of SARS-CoV-2 by FDA under an Emergency Use Authorization (EUA). This EUA will remain in effect (meaning this test can be used) for the duration of the COVID-19 declaration under Section 564(b)(1) of the Act, 21 U.S.C. section 360bbb-3(b)(1), unless the authorization is terminated or revoked.  Performed at [facility], [address] [place], [place] [address]   Troponin I (High Sensitivity)     Status: Abnormal   Collection Time: 01/19/24  1:43 AM  Result Value Ref Range   Troponin I (High Sensitivity) 19 (H) <18 ng/L    Comment: (NOTE) Elevated high sensitivity troponin I (hsTnI) values and significant  changes across serial  measurements may suggest ACS but many other  chronic and acute conditions are known to elevate hsTnI results.  Refer to the "Links" section for chest pain algorithms and additional  guidance. Performed at [facility], [address] [place], [place] [address]   POCT CBG (Fasting - Glucose)     Status: Abnormal   Collection Time: 03/13/24 11:10 AM  Result Value Ref Range   Glucose Fasting, POC 253 (A) 70 - 99 mg/dL  POC CREATINE & ALBUMIN,URINE     Status: Abnormal   Collection Time: 03/13/24 11:39 AM  Result Value Ref Range   Microalbumin Ur, POC 150  mg/L   Creatinine, POC 200 mg/dL   Albumin/Creatinine Ratio, Urine, POC >300       Assessment & Plan:  Check labs today.  Meds refilled.  Follow-up in 1 week.  May need to start insulin. Problem List Items Addressed This Visit     Type 2 diabetes mellitus with hyperglycemia, without long-term current use of insulin (HCC)   Relevant Orders   POCT CBG (Fasting - Glucose) (Completed)   Hemoglobin A1c   POC CREATINE & ALBUMIN,URINE (Completed)   COPD (chronic obstructive pulmonary disease) (HCC)   Relevant Orders   CBC with Diff   Hypothyroidism   Relevant Orders   TSH+T4F+T3Free   Combined hyperlipidemia associated with type 2 diabetes mellitus (HCC)   Relevant Medications   amLODipine (NORVASC) 10 MG tablet   Other Relevant Orders   Lipid Panel w/o Chol/HDL Ratio   Mild persistent asthma without complication   Hypertension associated with diabetes (HCC) - Primary   Relevant Medications   amLODipine (NORVASC) 10 MG tablet   Other Relevant Orders   CMP14+EGFR    Return in about 1 week (around 03/20/2024).   Total time spent: 30 minutes  Aisha Hove, MD  03/13/2024   This document may have been prepared by [facility] Voice Recognition software and as such may include unintentional dictation errors.

## 2024-03-14 LAB — TSH+T4F+T3FREE
Free T4: 1.08 ng/dL (ref 0.82–1.77)
T3, Free: 2.5 pg/mL (ref 2.0–4.4)
TSH: 4.59 u[IU]/mL — ABNORMAL HIGH (ref 0.450–4.500)

## 2024-03-14 LAB — CBC WITH DIFFERENTIAL/PLATELET
Basophils Absolute: 0.1 10*3/uL (ref 0.0–0.2)
Basos: 1 %
EOS (ABSOLUTE): 0.6 10*3/uL — ABNORMAL HIGH (ref 0.0–0.4)
Eos: 8 %
Hematocrit: 40.8 % (ref 34.0–46.6)
Hemoglobin: 13.2 g/dL (ref 11.1–15.9)
Immature Grans (Abs): 0 10*3/uL (ref 0.0–0.1)
Immature Granulocytes: 0 %
Lymphocytes Absolute: 3.2 10*3/uL — ABNORMAL HIGH (ref 0.7–3.1)
Lymphs: 42 %
MCH: 32.7 pg (ref 26.6–33.0)
MCHC: 32.4 g/dL (ref 31.5–35.7)
MCV: 101 fL — ABNORMAL HIGH (ref 79–97)
Monocytes Absolute: 0.8 10*3/uL (ref 0.1–0.9)
Monocytes: 11 %
Neutrophils Absolute: 2.8 10*3/uL (ref 1.4–7.0)
Neutrophils: 38 %
Platelets: 239 10*3/uL (ref 150–450)
RBC: 4.04 x10E6/uL (ref 3.77–5.28)
RDW: 12.2 % (ref 11.7–15.4)
WBC: 7.5 10*3/uL (ref 3.4–10.8)

## 2024-03-14 LAB — CMP14+EGFR
ALT: 9 IU/L (ref 0–32)
AST: 21 IU/L (ref 0–40)
Albumin: 3.9 g/dL (ref 3.8–4.8)
Alkaline Phosphatase: 80 IU/L (ref 44–121)
BUN/Creatinine Ratio: 13 (ref 12–28)
BUN: 9 mg/dL (ref 8–27)
Bilirubin Total: 0.8 mg/dL (ref 0.0–1.2)
CO2: 21 mmol/L (ref 20–29)
Calcium: 9 mg/dL (ref 8.7–10.3)
Chloride: 107 mmol/L — ABNORMAL HIGH (ref 96–106)
Creatinine, Ser: 0.69 mg/dL (ref 0.57–1.00)
Globulin, Total: 2.3 g/dL (ref 1.5–4.5)
Glucose: 78 mg/dL (ref 70–99)
Potassium: 4.1 mmol/L (ref 3.5–5.2)
Sodium: 142 mmol/L (ref 134–144)
Total Protein: 6.2 g/dL (ref 6.0–8.5)
eGFR: 93 mL/min/{1.73_m2} (ref >59)

## 2024-03-14 LAB — LIPID PANEL W/O CHOL/HDL RATIO
Cholesterol, Total: 131 mg/dL (ref 100–199)
HDL: 54 mg/dL (ref >39)
LDL Chol Calc (NIH): 62 mg/dL (ref 0–99)
Triglycerides: 72 mg/dL (ref 0–149)
VLDL Cholesterol Cal: 15 mg/dL (ref 5–40)

## 2024-03-14 LAB — HEMOGLOBIN A1C
Est. average glucose Bld gHb Est-mCnc: 94 mg/dL
Hgb A1c MFr Bld: 4.9 % (ref 4.8–5.6)

## 2024-03-22 ENCOUNTER — Encounter: Payer: Self-pay | Admitting: Internal Medicine

## 2024-03-22 ENCOUNTER — Ambulatory Visit: Admitting: Internal Medicine

## 2024-03-22 VITALS — BP 142/84 | HR 90 | Ht 61.0 in | Wt 190.2 lb

## 2024-03-22 DIAGNOSIS — E039 Hypothyroidism, unspecified: Secondary | ICD-10-CM

## 2024-03-22 DIAGNOSIS — E782 Mixed hyperlipidemia: Secondary | ICD-10-CM

## 2024-03-22 DIAGNOSIS — E1165 Type 2 diabetes mellitus with hyperglycemia: Secondary | ICD-10-CM

## 2024-03-22 DIAGNOSIS — I152 Hypertension secondary to endocrine disorders: Secondary | ICD-10-CM | POA: Diagnosis not present

## 2024-03-22 DIAGNOSIS — Z800 Family history of malignant neoplasm of digestive organs: Secondary | ICD-10-CM

## 2024-03-22 DIAGNOSIS — Z1231 Encounter for screening mammogram for malignant neoplasm of breast: Secondary | ICD-10-CM | POA: Diagnosis not present

## 2024-03-22 DIAGNOSIS — E1159 Type 2 diabetes mellitus with other circulatory complications: Secondary | ICD-10-CM

## 2024-03-22 LAB — HEMOGLOBIN A1C
Est. average glucose Bld gHb Est-mCnc: 240 mg/dL
Hgb A1c MFr Bld: 10 % — ABNORMAL HIGH (ref 4.8–5.6)

## 2024-03-22 LAB — POCT CBG (FASTING - GLUCOSE)-MANUAL ENTRY: Glucose Fasting, POC: 287 mg/dL — AB (ref 70–99)

## 2024-03-22 NOTE — Progress Notes (Signed)
 Established Patient Office Visit  Subjective:  Patient ID: Erica Camacho, female    DOB: 1951/10/04  Age: 73 y.o. MRN: [id number]  Chief Complaint  Patient presents with   Follow-up    1 week follow up    Patient comes in for her follow-up today accompanied by her friend.  She is feeling well and has restarted her medications.  Her blood pressure is looking much better but it is still above normal since she had not taken her medications yet.  She denies any headache or dizziness, no nausea or vomiting, no abdominal pain.  She is waiting to get her mammogram scheduled.  And she is trying to make an appointment for the eye exam. Labs discussed.  Her hemoglobin A1c is actually 4.9 although her fingerstick glucose is 287. Will repeat her hemoglobin A1c to confirm.    No other concerns at this time.   Past Medical History:  Diagnosis Date   Anemia 02/04/2023   COPD (chronic obstructive pulmonary disease) (HCC)    Diabetes mellitus without complication (HCC)    Dyspnea    Heart murmur    Hyperlipidemia    Hypertension    Hypothyroidism    Thyroid  disease    Vertigo    Wheezing    OCCAS    Past Surgical History:  Procedure Laterality Date   CATARACT EXTRACTION W/PHACO Left 10/05/2018   Procedure: CATARACT EXTRACTION PHACO AND INTRAOCULAR LENS PLACEMENT (IOC);  Surgeon: Ola Berger, MD;  Location: [facility] ORS;  Service: Ophthalmology;  Laterality: Left;  US  00:46.5 CDE 8.29 Fluid Pack Lot # [id number]   CESAREAN SECTION     X 3   FRACTURE SURGERY      Social History   Socioeconomic History   Marital status: Single    Spouse name: Not on file   Number of children: Not on file   Years of education: Not on file   Highest education level: Not on file  Occupational History   Not on file  Tobacco Use   Smoking status: Former   Smokeless tobacco: Never  Vaping Use   Vaping status: Never Used  Substance and Sexual Activity   Alcohol use: No   Drug use: No   Sexual  activity: Not on file  Other Topics Concern   Not on file  Social History Narrative   Not on file   Social Drivers of Health   Financial Resource Strain: Not on file  Food Insecurity: Not on file  Transportation Needs: Not on file  Physical Activity: Not on file  Stress: Not on file  Social Connections: Not on file  Intimate Partner Violence: Not on file    History reviewed. No pertinent family history.  Allergies  Allergen Reactions   Aspirin Hives and Other (See Comments)    Allergic to aspirin that is not coated. Hives and GI upset   Ampicillin Hives   Carrot [Daucus Carota] Hives   Codeine Hives   Latex Itching   Penicillins Hives and Other (See Comments)    Has patient had a PCN reaction causing immediate rash, facial/tongue/throat swelling, SOB or lightheadedness with hypotension: No Has patient had a PCN reaction causing severe rash involving mucus membranes or skin necrosis: Yes Has patient had a PCN reaction that required hospitalization: Yes Has patient had a PCN reaction occurring within the last 10 years: No If all of the above answers are "NO", then may proceed with Cephalosporin use.     Outpatient Medications Prior to  Visit  Medication Sig   Accu-Chek FastClix Lancets MISC USE ONCE A DAY AS NEEDED   albuterol  (PROVENTIL ) (2.5 MG/3ML) 0.083% nebulizer solution Take 3 mLs (2.5 mg total) by nebulization every 4 (four) hours as needed for wheezing or shortness of breath.   Albuterol -Budesonide (AIRSUPRA ) 90-80 MCG/ACT AERO Inhale 1 puff into the lungs every 6 (six) hours as needed.   amLODipine  (NORVASC ) 10 MG tablet Take 1 tablet (10 mg total) by mouth daily.   aspirin EC 81 MG tablet Take 81 mg by mouth daily.   atorvastatin  (LIPITOR) 40 MG tablet TAKE 1 TABLET BY MOUTH EVERY DAY   diltiazem  (CARDIZEM  LA) 120 MG 24 hr tablet Take 1 tablet (120 mg total) by mouth daily.   glimepiride  (AMARYL ) 4 MG tablet Take 1 tablet (4 mg total) by mouth 2 (two) times daily.    glucose blood (ACCU-CHEK GUIDE) test strip Use as instructed to check glucose up to twice daily   levothyroxine  (SYNTHROID ) 137 MCG tablet TAKE 1 TABLET BY MOUTH EVERYDAY IN THE MORNING ON AN EMPTY STOMACH, NO OTHER MEDS OR FOODS FOR AN HOUR   losartan  (COZAAR ) 100 MG tablet Take 1 tablet (100 mg total) by mouth daily.   meclizine  (ANTIVERT ) 25 MG tablet Take 1 tablet (25 mg total) by mouth every 6 (six) hours as needed for dizziness or nausea.   Multiple Vitamin (MULTI-VITAMIN) tablet Take 1 tablet by mouth every other day.   Multiple Vitamins-Minerals (EMERGEN-C IMMUNE) PACK Take 1 packet by mouth daily as needed (immune support).   pioglitazone (ACTOS) 30 MG tablet Take 30 mg by mouth daily.   sitaGLIPtin  (JANUVIA ) 100 MG tablet Take 1 tablet (100 mg total) by mouth daily.   triamterene -hydrochlorothiazide  (MAXZIDE -25) 37.5-25 MG tablet Take 1 tablet by mouth daily.   Cholecalciferol (VITAMIN D3) 1.25 MG (50000 UT) CAPS Take 1 capsule by mouth every 7 (seven) days.  (Patient not taking: Reported on 03/22/2024)   fluticasone  (FLONASE ) 50 MCG/ACT nasal spray Place 1 spray into both nostrils daily. (Patient not taking: Reported on 03/13/2024)   ivabradine  (CORLANOR) 5 MG TABS tablet Take tablets (15mg ) TWO hours prior to your cardiac CT Scan. (Patient not taking: Reported on 03/22/2024)   metoprolol  tartrate (LOPRESSOR ) 100 MG tablet Take 1 tablet (100 mg total) by mouth once for 1 dose. Take two hours prior to your CT. (Patient not taking: Reported on 03/13/2024)   Semaglutide ,0.25 or 0.5MG /DOS, 2 MG/3ML SOPN Inject 0.25 mg into the skin once a week. (Patient not taking: Reported on 03/22/2024)   Tetrahydrozoline HCl (VISINE OP) Place 1 drop into both eyes daily as needed (dry eyes). (Patient not taking: Reported on 03/22/2024)   [DISCONTINUED] acetaminophen  (TYLENOL ) 500 MG tablet Take 500 mg by mouth daily as needed for moderate pain or headache. (Patient not taking: Reported on 03/22/2024)    [DISCONTINUED] amLODipine  (NORVASC ) 10 MG tablet Take 10 mg by mouth daily. (Patient not taking: Reported on 03/22/2024)   [DISCONTINUED] dapagliflozin  propanediol (FARXIGA ) 10 MG TABS tablet Take 1 tablet (10 mg total) by mouth daily before breakfast. (Patient not taking: Reported on 03/22/2024)   [DISCONTINUED] famotidine  (PEPCID ) 20 MG tablet Take 1 tablet (20 mg total) by mouth 2 (two) times daily. (Patient not taking: Reported on 03/13/2024)   No facility-administered medications prior to visit.    Review of Systems  Constitutional: Negative.  Negative for chills, fever, malaise/fatigue and weight loss.  HENT: Negative.    Eyes: Negative.   Respiratory: Negative.  Negative for cough  and shortness of breath.   Cardiovascular: Negative.  Negative for chest pain, palpitations and leg swelling.  Gastrointestinal: Negative.  Negative for abdominal pain, constipation, diarrhea, heartburn, nausea and vomiting.  Genitourinary: Negative.  Negative for dysuria and flank pain.  Musculoskeletal: Negative.  Negative for joint pain and myalgias.  Skin: Negative.   Neurological: Negative.  Negative for dizziness and headaches.  Endo/Heme/Allergies: Negative.   Psychiatric/Behavioral: Negative.  Negative for depression and suicidal ideas. The patient is not nervous/anxious.        Objective:   BP (!) 142/84   Pulse 90   Ht 5\' 1"  (1.549 m)   Wt 190 lb 3.2 oz (86.3 kg)   SpO2 97%   BMI 35.94 kg/m   Vitals:   03/22/24 1045  BP: (!) 142/84  Pulse: 90  Height: 5\' 1"  (1.549 m)  Weight: 190 lb 3.2 oz (86.3 kg)  SpO2: 97%  BMI (Calculated): 35.96    Physical Exam   Results for orders placed or performed in visit on 03/22/24  POCT CBG (Fasting - Glucose)  Result Value Ref Range   Glucose Fasting, POC 287 (A) 70 - 99 mg/dL    Recent Results (from the past 2160 hours)  CBC with Differential     Status: Abnormal   Collection Time: 01/18/24 11:18 PM  Result Value Ref Range   WBC 8.2 4.0  - 10.5 K/uL   RBC 4.54 3.87 - 5.11 MIL/uL   Hemoglobin 12.3 12.0 - 15.0 g/dL   HCT 84.1 32.4 - 40.1 %   MCV 84.4 80.0 - 100.0 fL   MCH 27.1 26.0 - 34.0 pg   MCHC 32.1 30.0 - 36.0 g/dL   RDW 02.7 25.3 - 66.4 %   Platelets 200 150 - 400 K/uL   nRBC 0.0 0.0 - 0.2 %   Neutrophils Relative % 80 %   Neutro Abs 6.5 1.7 - 7.7 K/uL   Lymphocytes Relative 8 %   Lymphs Abs 0.6 (L) 0.7 - 4.0 K/uL   Monocytes Relative 11 %   Monocytes Absolute 0.9 0.1 - 1.0 K/uL   Eosinophils Relative 1 %   Eosinophils Absolute 0.1 0.0 - 0.5 K/uL   Basophils Relative 0 %   Basophils Absolute 0.0 0.0 - 0.1 K/uL   Immature Granulocytes 0 %   Abs Immature Granulocytes 0.03 0.00 - 0.07 K/uL    Comment: Performed at [facility], [address] [place], [place] [address]  Comprehensive metabolic panel     Status: Abnormal   Collection Time: 01/18/24 11:18 PM  Result Value Ref Range   Sodium 138 135 - 145 mmol/L   Potassium 3.9 3.5 - 5.1 mmol/L   Chloride 101 98 - 111 mmol/L   CO2 23 22 - 32 mmol/L   Glucose, Bld 297 (H) 70 - 99 mg/dL    Comment: Glucose reference range applies only to samples taken after fasting for at least 8 hours.   BUN 13 8 - 23 mg/dL   Creatinine, Ser 4.25 0.44 - 1.00 mg/dL   Calcium  9.2 8.9 - 10.3 mg/dL   Total Protein 7.4 6.5 - 8.1 g/dL   Albumin 4.3 3.5 - 5.0 g/dL   AST 24 15 - 41 U/L   ALT 20 0 - 44 U/L   Alkaline Phosphatase 64 38 - 126 U/L   Total Bilirubin 0.8 0.0 - 1.2 mg/dL   GFR, Estimated >95 >63 mL/min    Comment: (NOTE) Calculated using the CKD-EPI Creatinine Equation (2021)  Anion gap 14 5 - 15    Comment: Performed at [facility], [address] [place], [place] [address]  Lipase, blood     Status: None   Collection Time: 01/18/24 11:18 PM  Result Value Ref Range   Lipase 36 11 - 51 U/L    Comment: Performed at [facility], [address] [place], [place] [address]  Troponin I (High Sensitivity)     Status: Abnormal    Collection Time: 01/18/24 11:18 PM  Result Value Ref Range   Troponin I (High Sensitivity) 18 (H) <18 ng/L    Comment: (NOTE) Elevated high sensitivity troponin I (hsTnI) values and significant  changes across serial measurements may suggest ACS but many other  chronic and acute conditions are known to elevate hsTnI results.  Refer to the "Links" section for chest pain algorithms and additional  guidance. Performed at [facility], [address] [place], [place] [address]   Resp panel by RT-PCR (RSV, Flu A&B, Covid) Anterior Nasal Swab     Status: Abnormal   Collection Time: 01/18/24 11:18 PM   Specimen: Anterior Nasal Swab  Result Value Ref Range   SARS Coronavirus 2 by RT PCR NEGATIVE NEGATIVE    Comment: (NOTE) SARS-CoV-2 target nucleic acids are NOT DETECTED.  The SARS-CoV-2 RNA is generally detectable in upper respiratory specimens during the acute phase of infection. The lowest concentration of SARS-CoV-2 viral copies this assay can detect is 138 copies/mL. A negative result does not preclude SARS-Cov-2 infection and should not be used as the sole basis for treatment or other patient management decisions. A negative result may occur with  improper specimen collection/handling, submission of specimen other than nasopharyngeal swab, presence of viral mutation(s) within the areas targeted by this assay, and inadequate number of viral copies(<138 copies/mL). A negative result must be combined with clinical observations, patient history, and epidemiological information. The expected result is Negative.  Fact Sheet for Patients:  BloggerCourse.com  Fact Sheet for Healthcare Providers:  SeriousBroker.it  This test is no t yet approved or cleared by the [place]  FDA and  has been authorized for detection and/or diagnosis of SARS-CoV-2 by FDA under an Emergency Use Authorization (EUA). This EUA will remain  in  effect (meaning this test can be used) for the duration of the COVID-19 declaration under Section 564(b)(1) of the Act, 21 U.S.C.section 360bbb-3(b)(1), unless the authorization is terminated  or revoked sooner.       Influenza A by PCR POSITIVE (A) NEGATIVE   Influenza B by PCR NEGATIVE NEGATIVE    Comment: (NOTE) The Xpert Xpress SARS-CoV-2/FLU/RSV plus assay is intended as an aid in the diagnosis of influenza from Nasopharyngeal swab specimens and should not be used as a sole basis for treatment. Nasal washings and aspirates are unacceptable for Xpert Xpress SARS-CoV-2/FLU/RSV testing.  Fact Sheet for Patients: BloggerCourse.com  Fact Sheet for Healthcare Providers: SeriousBroker.it  This test is not yet approved or cleared by the [place]  FDA and has been authorized for detection and/or diagnosis of SARS-CoV-2 by FDA under an Emergency Use Authorization (EUA). This EUA will remain in effect (meaning this test can be used) for the duration of the COVID-19 declaration under Section 564(b)(1) of the Act, 21 U.S.C. section 360bbb-3(b)(1), unless the authorization is terminated or revoked.     Resp Syncytial Virus by PCR NEGATIVE NEGATIVE    Comment: (NOTE) Fact Sheet for Patients: BloggerCourse.com  Fact Sheet for Healthcare Providers: SeriousBroker.it  This test is  not yet approved or cleared by the [place]  FDA and has been authorized for detection and/or diagnosis of SARS-CoV-2 by FDA under an Emergency Use Authorization (EUA). This EUA will remain in effect (meaning this test can be used) for the duration of the COVID-19 declaration under Section 564(b)(1) of the Act, 21 U.S.C. section 360bbb-3(b)(1), unless the authorization is terminated or revoked.  Performed at [facility], [address] [place], [place] [address]   Troponin I (High  Sensitivity)     Status: Abnormal   Collection Time: 01/19/24  1:43 AM  Result Value Ref Range   Troponin I (High Sensitivity) 19 (H) <18 ng/L    Comment: (NOTE) Elevated high sensitivity troponin I (hsTnI) values and significant  changes across serial measurements may suggest ACS but many other  chronic and acute conditions are known to elevate hsTnI results.  Refer to the "Links" section for chest pain algorithms and additional  guidance. Performed at [facility], [address] [place], [place] [address]   POCT CBG (Fasting - Glucose)     Status: Abnormal   Collection Time: 03/13/24 11:10 AM  Result Value Ref Range   Glucose Fasting, POC 253 (A) 70 - 99 mg/dL  POC CREATINE & ALBUMIN,URINE     Status: Abnormal   Collection Time: 03/13/24 11:39 AM  Result Value Ref Range   Microalbumin Ur, POC 150 mg/L   Creatinine, POC 200 mg/dL   Albumin/Creatinine Ratio, Urine, POC >300   CBC with Diff     Status: Abnormal   Collection Time: 03/13/24 11:51 AM  Result Value Ref Range   WBC 7.5 3.4 - 10.8 x10E3/uL   RBC 4.04 3.77 - 5.28 x10E6/uL   Hemoglobin 13.2 11.1 - 15.9 g/dL   Hematocrit 66.4 40.3 - 46.6 %   MCV 101 (H) 79 - 97 fL   MCH 32.7 26.6 - 33.0 pg   MCHC 32.4 31.5 - 35.7 g/dL   RDW 47.4 25.9 - 56.3 %   Platelets 239 150 - 450 x10E3/uL   Neutrophils 38 Not Estab. %   Lymphs 42 Not Estab. %   Monocytes 11 Not Estab. %   Eos 8 Not Estab. %   Basos 1 Not Estab. %   Neutrophils Absolute 2.8 1.4 - 7.0 x10E3/uL   Lymphocytes Absolute 3.2 (H) 0.7 - 3.1 x10E3/uL   Monocytes Absolute 0.8 0.1 - 0.9 x10E3/uL   EOS (ABSOLUTE) 0.6 (H) 0.0 - 0.4 x10E3/uL   Basophils Absolute 0.1 0.0 - 0.2 x10E3/uL   Immature Granulocytes 0 Not Estab. %   Immature Grans (Abs) 0.0 0.0 - 0.1 x10E3/uL  CMP14+EGFR     Status: Abnormal   Collection Time: 03/13/24 11:51 AM  Result Value Ref Range   Glucose 78 70 - 99 mg/dL   BUN 9 8 - 27 mg/dL   Creatinine, Ser 8.75 0.57 - 1.00 mg/dL   eGFR  93 >64 PP/IRJ/1.88   BUN/Creatinine Ratio 13 12 - 28   Sodium 142 134 - 144 mmol/L   Potassium 4.1 3.5 - 5.2 mmol/L   Chloride 107 (H) 96 - 106 mmol/L   CO2 21 20 - 29 mmol/L   Calcium  9.0 8.7 - 10.3 mg/dL   Total Protein 6.2 6.0 - 8.5 g/dL   Albumin 3.9 3.8 - 4.8 g/dL   Globulin, Total 2.3 1.5 - 4.5 g/dL   Bilirubin Total 0.8 0.0 - 1.2 mg/dL   Alkaline Phosphatase 80 44 - 121 IU/L   AST 21 0 -  40 IU/L   ALT 9 0 - 32 IU/L  TSH+T4F+T3Free     Status: Abnormal   Collection Time: 03/13/24 11:51 AM  Result Value Ref Range   TSH 4.590 (H) 0.450 - 4.500 uIU/mL   T3, Free 2.5 2.0 - 4.4 pg/mL   Free T4 1.08 0.82 - 1.77 ng/dL  Lipid Panel w/o Chol/HDL Ratio     Status: None   Collection Time: 03/13/24 11:51 AM  Result Value Ref Range   Cholesterol, Total 131 100 - 199 mg/dL   Triglycerides 72 0 - 149 mg/dL   HDL 54 >69 mg/dL   VLDL Cholesterol Cal 15 5 - 40 mg/dL   LDL Chol Calc (NIH) 62 0 - 99 mg/dL  Hemoglobin G2X     Status: None   Collection Time: 03/13/24 11:51 AM  Result Value Ref Range   Hgb A1c MFr Bld 4.9 4.8 - 5.6 %    Comment:          Prediabetes: 5.7 - 6.4          Diabetes: >6.4          Glycemic control for adults with diabetes: <7.0    Est. average glucose Bld gHb Est-mCnc 94 mg/dL  POCT CBG (Fasting - Glucose)     Status: Abnormal   Collection Time: 03/22/24 10:50 AM  Result Value Ref Range   Glucose Fasting, POC 287 (A) 70 - 99 mg/dL      Assessment & Plan:  Patient advised to continue taking all her medications as such, along with strict diet control. She will get her mammogram and diabetic eye exam within next 3 months. Problem List Items Addressed This Visit     Type 2 diabetes mellitus with hyperglycemia, without long-term current use of insulin  (HCC)   Relevant Orders   POCT CBG (Fasting - Glucose) (Completed)   Hemoglobin A1c   Hypothyroidism   Combined hyperlipidemia associated with type 2 diabetes mellitus (HCC)   Hypertension associated with  diabetes (HCC) - Primary   Other Visit Diagnoses       Breast cancer screening by mammogram       Relevant Orders   MM 3D SCREENING MAMMOGRAM BILATERAL BREAST     Family history of colon cancer       Relevant Orders   Ambulatory referral to Gastroenterology       Return in about 3 months (around 06/21/2024).   Total time spent: 30 minutes  Aisha Hove, MD  03/22/2024   This document may have been prepared by [facility] Voice Recognition software and as such may include unintentional dictation errors.

## 2024-03-30 ENCOUNTER — Other Ambulatory Visit: Payer: Self-pay

## 2024-03-30 DIAGNOSIS — E1165 Type 2 diabetes mellitus with hyperglycemia: Secondary | ICD-10-CM

## 2024-03-30 NOTE — Progress Notes (Signed)
 03/30/2024 Name: Erica Camacho MRN: [id number] DOB: 05-06-1951  Chief Complaint  Patient presents with   Diabetes   Medication Management    Erica Camacho is a 73 y.o. year old female who presented for a telephone visit.   They were referred to the pharmacist by a quality report for assistance in managing diabetes.    Subjective:  Care Team: Primary Care Provider: Aisha Hove, MD   Medication Access/Adherence  Current Pharmacy:  [facility] [phone] [facility], [place] - [address] [place] [address] Phone: [phone] Fax: [phone]   Patient reports affordability concerns with their medications: No  Patient reports access/transportation concerns to their pharmacy: No  Patient reports adherence concerns with their medications:  Yes  - sometimes [facility] is late to send her medications   Diabetes:  Current medications: Januvia  100mg  daily, Glimepiride  4mg  BID Medications tried in the past: Actos, Ozempic , Farxiga , Jardiance, Metformin Trulicity  Current glucose readings: None, needs lancets and strips Using AccChek Guide meter   Observed patterns:  Patient denies hypoglycemic s/sx including dizziness, shakiness, sweating. Patient denies hyperglycemic symptoms including polyuria, polydipsia, polyphagia, nocturia, neuropathy, blurred vision.  Current meal patterns:  -Not discussed today  Current physical activity: limited due to knee pain  Current medication access support: None   Objective:  Lab Results  Component Value Date   HGBA1C 10.0 (H) 03/22/2024    Lab Results  Component Value Date   CREATININE 0.69 03/13/2024   BUN 9 03/13/2024   NA 142 03/13/2024   K 4.1 03/13/2024   CL 107 (H) 03/13/2024   CO2 21 03/13/2024    Lab Results  Component Value Date   CHOL 131 03/13/2024   HDL 54 03/13/2024   LDLCALC 62 03/13/2024   TRIG 72 03/13/2024    Medications Reviewed Today     Reviewed by Carnell Christian, RPH  (Pharmacist) on 03/30/24 at 1356  Med List Status: <None>   Medication Order Taking? Sig Documenting Provider Last Dose Status Informant  Accu-Chek FastClix Lancets MISC [id number] Yes USE ONCE A DAY AS NEEDED Glendale Landmark, NP Taking Active   albuterol  (PROVENTIL ) (2.5 MG/3ML) 0.083% nebulizer solution [id number] Yes Take 3 mLs (2.5 mg total) by nebulization every 4 (four) hours as needed for wheezing or shortness of breath. Scoggins, Hospital doctor, NP Taking Active   Albuterol -Budesonide (AIRSUPRA ) 90-80 MCG/ACT AERO [id number]  Inhale 1 puff into the lungs every 6 (six) hours as needed. Scoggins, Amber, NP  Active   amLODipine  (NORVASC ) 10 MG tablet [id number] Yes Take 1 tablet (10 mg total) by mouth daily. Aisha Hove, MD Taking Active   aspirin EC 81 MG tablet [id number] Yes Take 81 mg by mouth daily. [provider] Taking Active Self  atorvastatin  (LIPITOR) 40 MG tablet [id number] Yes TAKE 1 TABLET BY MOUTH EVERY DAY Trenda Frisk, FNP Taking Active   Cholecalciferol (VITAMIN D3) 1.25 MG (50000 UT) CAPS [id number]  Take 1 capsule by mouth every 7 (seven) days.   Patient not taking: Reported on 03/22/2024   [provider]  Active Self           Med Note Vara Gentle Jan 01, 2019 12:04 PM)    diltiazem  (CARDIZEM  LA) 120 MG 24 hr tablet [id number] Yes Take 1 tablet (120 mg total) by mouth daily. Scoggins, Amber, NP Taking Active   fluticasone  (FLONASE ) 50 MCG/ACT nasal spray [id number]  Place 1 spray into both nostrils daily.  Patient not taking: Reported on 03/13/2024   Aisha Hove, MD  Expired 08/28/23 2359   glimepiride  (AMARYL ) 4 MG tablet [id number]  Take 1 tablet (4 mg total) by mouth 2 (two) times daily. Aisha Hove, MD  Expired 03/22/24 2359   glucose blood (ACCU-CHEK GUIDE) test strip [id number]  Use as instructed to check glucose up to twice daily Aisha Hove, MD  Active   ivabradine  (CORLANOR) 5 MG TABS tablet [id number]  Take tablets (15mg ) TWO hours  prior to your cardiac CT Scan.  Patient not taking: Reported on 03/22/2024   Gollan, Timothy J, MD  Active   levothyroxine  (SYNTHROID ) 137 MCG tablet [id number] Yes TAKE 1 TABLET BY MOUTH EVERYDAY IN THE MORNING ON AN EMPTY STOMACH, NO OTHER MEDS OR FOODS FOR AN HOUR Trenda Frisk, FNP Taking Active   losartan  (COZAAR ) 100 MG tablet [id number] Yes Take 1 tablet (100 mg total) by mouth daily. Trenda Frisk, FNP Taking Active   meclizine  (ANTIVERT ) 25 MG tablet [id number]  Take 1 tablet (25 mg total) by mouth every 6 (six) hours as needed for dizziness or nausea. Aisha Hove, MD  Active   metoprolol  tartrate (LOPRESSOR ) 100 MG tablet [id number]  Take 1 tablet (100 mg total) by mouth once for 1 dose. Take two hours prior to your CT.  Patient not taking: Reported on 03/13/2024   Gollan, Timothy J, MD  Expired 07/02/22 2359   Multiple Vitamin (MULTI-VITAMIN) tablet [id number] Yes Take 1 tablet by mouth every other day. [provider] Taking Active   Multiple Vitamins-Minerals (EMERGEN-C IMMUNE) PACK [id number]  Take 1 packet by mouth daily as needed (immune support). [provider]  Active Self  Semaglutide ,0.25 or 0.5MG /DOS, 2 MG/3ML SOPN [id number]  Inject 0.25 mg into the skin once a week.  Patient not taking: Reported on 03/22/2024   Aisha Hove, MD  Active   sitaGLIPtin  (JANUVIA ) 100 MG tablet [id number] Yes Take 1 tablet (100 mg total) by mouth daily. Aisha Hove, MD Taking Active   Tetrahydrozoline HCl [facility] OP) [id number]  Place 1 drop into both eyes daily as needed (dry eyes).  Patient not taking: Reported on 03/22/2024   [provider]  Active Self  triamterene -hydrochlorothiazide  (MAXZIDE -25) 37.5-25 MG tablet [id number] Yes Take 1 tablet by mouth daily. Trenda Frisk, FNP Taking Active               Assessment/Plan:   Diabetes: - Currently uncontrolled, may be out of some meds, patient is not certain - Reviewed long term cardiovascular and  renal outcomes of uncontrolled blood sugar - Reviewed goal A1c, goal fasting, and goal 2 hour post prandial glucose - Reviewed dietary modifications including low carb diet  - Recommend to continue current medication therapy for now, patient open to re-trailing jardiance 10mg  in future or ozempic  in place of januvia  - Patient denies personal or family history of multiple endocrine neoplasia type 2, medullary thyroid  cancer; personal history of pancreatitis or gallbladder disease. -Check BG daily, sending refill request on lancets and strips to PCP -Recommend coming in person to office with meds for full review   Follow Up Plan: 04/03/24 in office  Carnell Christian, PharmD Clinical Pharmacist [phone]

## 2024-04-03 ENCOUNTER — Other Ambulatory Visit: Payer: Self-pay

## 2024-04-03 ENCOUNTER — Ambulatory Visit

## 2024-04-03 DIAGNOSIS — E1165 Type 2 diabetes mellitus with hyperglycemia: Secondary | ICD-10-CM

## 2024-04-03 DIAGNOSIS — E669 Obesity, unspecified: Secondary | ICD-10-CM

## 2024-04-03 MED ORDER — ACCU-CHEK FASTCLIX LANCETS MISC: 3 refills | Status: AC

## 2024-04-03 MED ORDER — GLIMEPIRIDE 4 MG PO TABS: 4.0000 mg | ORAL_TABLET | Freq: Two times a day (BID) | ORAL | 3 refills | Status: DC

## 2024-04-03 MED ORDER — ACCU-CHEK GUIDE TEST VI STRP: ORAL_STRIP | 12 refills | Status: DC

## 2024-04-03 MED ORDER — DILTIAZEM HCL ER 120 MG PO TB24: 120.0000 mg | ORAL_TABLET | Freq: Every day | ORAL | 3 refills | Status: AC

## 2024-04-03 NOTE — Progress Notes (Signed)
 04/03/2024 Name: Erica Camacho MRN: [id number] DOB: [birth year]  Chief Complaint  Patient presents with   Medication Management   Diabetes    Erica Camacho is a 73 y.o. year old female who presented for a telephone visit.   They were referred to the pharmacist by a quality report for assistance in managing diabetes.    Subjective:  Care Team: Primary Care Provider: Aisha Hove, MD   Medication Access/Adherence  Current Pharmacy:  [facility] [phone], [place] - [address] [place] [address] Phone: [phone] Fax: [phone]   Patient reports affordability concerns with their medications: No  Patient reports access/transportation concerns to their pharmacy: No  Patient reports adherence concerns with their medications:  Yes  - sometimes [facility] is late to send her medications   Diabetes:  Current medications: Januvia  100mg  daily, Glimepiride  4mg  BID Medications tried in the past: Actos, Ozempic , Farxiga , Jardiance, Metformin Trulicity  Current glucose readings: checking at varying times inconsistently, 150s sometime after a meal is most recent one she can recall, did not bring meter to appointment Using AccChek Guide meter   Observed patterns:  Patient denies hypoglycemic s/sx including dizziness, shakiness, sweating. Patient denies hyperglycemic symptoms including polyuria, polydipsia, polyphagia, nocturia, neuropathy, blurred vision.  Current meal patterns:  -Not discussed today  Current physical activity: limited due to knee pain  Current medication access support: None   Objective:  Lab Results  Component Value Date   HGBA1C 10.0 (H) 03/22/2024    Lab Results  Component Value Date   CREATININE 0.69 03/13/2024   BUN 9 03/13/2024   NA 142 03/13/2024   K 4.1 03/13/2024   CL 107 (H) 03/13/2024   CO2 21 03/13/2024    Lab Results  Component Value Date   CHOL 131 03/13/2024   HDL 54 03/13/2024   LDLCALC 62 03/13/2024    TRIG 72 03/13/2024    Medications Reviewed Today     Reviewed by Carnell Christian, RPH (Pharmacist) on 04/03/24 at 1133  Med List Status: <None>   Medication Order Taking? Sig Documenting Provider Last Dose Status Informant  Accu-Chek FastClix Lancets MISC [id number] Yes Use with device to check sugars three times daily Aisha Hove, MD Taking Active   albuterol  (PROVENTIL ) (2.5 MG/3ML) 0.083% nebulizer solution [id number] Yes Take 3 mLs (2.5 mg total) by nebulization every 4 (four) hours as needed for wheezing or shortness of breath. Scoggins, Hospital doctor, NP Taking Active   Albuterol -Budesonide (AIRSUPRA ) 90-80 MCG/ACT AERO [id number] Yes Inhale 1 puff into the lungs every 6 (six) hours as needed. Scoggins, Amber, NP Taking Active   amLODipine  (NORVASC ) 10 MG tablet [id number] Yes Take 1 tablet (10 mg total) by mouth daily. Aisha Hove, MD Taking Active   aspirin EC 81 MG tablet [id number] Yes Take 81 mg by mouth daily. [provider] Taking Active Self  atorvastatin  (LIPITOR) 40 MG tablet [id number] Yes TAKE 1 TABLET BY MOUTH EVERY DAY Trenda Frisk, FNP Taking Active   Cinnamon 500 MG TABS [id number] Yes Take 500 mg by mouth daily. [provider] Taking Active   Cranberry 500 MG TABS [id number] Yes Take 500 mg by mouth daily. [provider]  Active   diltiazem  (CARDIZEM  LA) 120 MG 24 hr tablet [id number] Yes Take 1 tablet (120 mg total) by mouth daily. Scoggins, Amber, NP Taking Active   fluticasone  (FLONASE ) 50 MCG/ACT nasal spray [id number]  Place 1 spray into both nostrils daily.  Patient not taking: Reported on 03/13/2024   Aisha Hove, MD  Expired 08/28/23 2359   glimepiride  (AMARYL ) 4 MG tablet [id number]  Take 1 tablet (4 mg total) by mouth 2 (two) times daily. Aisha Hove, MD  Expired 03/22/24 2359   glucose blood (ACCU-CHEK GUIDE TEST) test strip [id number]  Use with device to check sugars three times daily Aisha Hove, MD  Active   levothyroxine   (SYNTHROID ) 137 MCG tablet [id number] Yes TAKE 1 TABLET BY MOUTH EVERYDAY IN THE MORNING ON AN EMPTY STOMACH, NO OTHER MEDS OR FOODS FOR AN HOUR Trenda Frisk, FNP Taking Active   losartan  (COZAAR ) 100 MG tablet [id number] Yes Take 1 tablet (100 mg total) by mouth daily. Trenda Frisk, FNP Taking Active   meclizine  (ANTIVERT ) 25 MG tablet [id number]  Take 1 tablet (25 mg total) by mouth every 6 (six) hours as needed for dizziness or nausea. Aisha Hove, MD  Active   metoprolol  tartrate (LOPRESSOR ) 100 MG tablet [id number]  Take 1 tablet (100 mg total) by mouth once for 1 dose. Take two hours prior to your CT.  Patient not taking: Reported on 03/13/2024   Gollan, Timothy J, MD  Active   Multiple Vitamin (MULTI-VITAMIN) tablet [id number] Yes Take 1 tablet by mouth every other day. [provider] Taking Active   Semaglutide ,0.25 or 0.5MG /DOS, 2 MG/3ML SOPN [id number]  Inject 0.25 mg into the skin once a week.  Patient not taking: Reported on 03/22/2024   Aisha Hove, MD  Active   sitaGLIPtin  (JANUVIA ) 100 MG tablet [id number] Yes Take 1 tablet (100 mg total) by mouth daily. Aisha Hove, MD Taking Active   Tetrahydrozoline HCl [facility] OP) [id number]  Place 1 drop into both eyes daily as needed (dry eyes).  Patient not taking: Reported on 03/22/2024   [provider]  Active Self  triamterene -hydrochlorothiazide  (MAXZIDE -25) 37.5-25 MG tablet [id number] Yes Take 1 tablet by mouth daily. Trenda Frisk, FNP Taking Active               Assessment/Plan:   Diabetes: - Currently uncontrolled (patient states she was out of glimepiride  for some time prior to A1c check) - Reviewed long term cardiovascular and renal outcomes of uncontrolled blood sugar - Reviewed goal A1c, goal fasting, and goal 2 hour post prandial glucose - Reviewed dietary modifications including low carb diet  -Counseled to check BG daily at varying times - Recommend to continue current medication  therapy for now, patient open to re-trailing jardiance 10mg . [facility] application for Ozempic  PAP submitted so patient can hopefully resume, pending response. - Patient denies personal or family history of multiple endocrine neoplasia type 2, medullary thyroid  cancer; personal history of pancreatitis or gallbladder disease. -[facility] filled glimepiride  with old instructions for once daily, requesting updated rx  Miscellaneous: Patient also states she has a difficult time swallowing diltiazem  capsules and [facility] only [facility] capsules. Will coordinate new rx with prescriber.  Follow Up Plan: 1 week  Carnell Christian, PharmD Clinical Pharmacist [phone]

## 2024-04-10 ENCOUNTER — Other Ambulatory Visit: Payer: Self-pay

## 2024-04-10 DIAGNOSIS — E1165 Type 2 diabetes mellitus with hyperglycemia: Secondary | ICD-10-CM

## 2024-04-10 NOTE — Progress Notes (Signed)
 04/10/2024 Name: Erica Camacho MRN: [id number] DOB: March 12, 1951  Chief Complaint  Patient presents with   Medication Management   Diabetes    Erica Camacho is a 73 y.o. year old female who presented for a telephone visit.   They were referred to the pharmacist by a quality report for assistance in managing diabetes.    Subjective:  Care Team: Primary Care Provider: Aisha Hove, MD   Medication Access/Adherence  [facility]acy[facility]  [facility] [phone], [place] - [address] [place] [address] Phone: [phone] Fax: [phone]   Patient reports affordability concerns with their medications: No  Patient reports access/transportation concerns to their pharmacy: No  Patient reports adherence concerns with their medications:  Yes  - sometimes [facility] is late to send her medications but she prefers to stay with them for certain meds at this time    Diabetes:  Current medications: Januvia  100mg  daily, Glimepiride  4mg  BID Medications tried in the past: Actos, Ozempic , Farxiga , Jardiance, Metformin Trulicity  Current glucose readings: checking at varying times inconsistently, 150s sometime after a meal is most recent one she can recall, did not bring meter to appointment Using AccChek Guide meter   Observed patterns:  Patient denies hypoglycemic s/sx including dizziness, shakiness, sweating. Patient denies hyperglycemic symptoms including polyuria, polydipsia, polyphagia, nocturia, neuropathy, blurred vision.  Current meal patterns:  -Not discussed today  Current physical activity: limited due to knee pain  Current medication access support: None   Objective:  Lab Results  Component Value Date   HGBA1C 10.0 (H) 03/22/2024    Lab Results  Component Value Date   CREATININE 0.69 03/13/2024   BUN 9 03/13/2024   NA 142 03/13/2024   K 4.1 03/13/2024   CL 107 (H) 03/13/2024   CO2 21 03/13/2024    Lab Results  Component Value Date    CHOL 131 03/13/2024   HDL 54 03/13/2024   LDLCALC 62 03/13/2024   TRIG 72 03/13/2024    Medications Reviewed Today     Reviewed by Carnell Christian, RPH (Pharmacist) on 04/10/24 at 1512  Med List Status: <None>   Medication Order Taking? Sig Documenting Provider Last Dose Status Informant  Accu-Chek FastClix Lancets MISC [id number] No Use with device to check sugars three times daily Aisha Hove, MD Taking Active   albuterol  (PROVENTIL ) (2.5 MG/3ML) 0.083% nebulizer solution [id number] No Take 3 mLs (2.5 mg total) by nebulization every 4 (four) hours as needed for wheezing or shortness of breath. Scoggins, Hospital doctor, NP Taking Active   Albuterol -Budesonide (AIRSUPRA ) 90-80 MCG/ACT AERO [id number] No Inhale 1 puff into the lungs every 6 (six) hours as needed. Scoggins, Hospital doctor, NP Taking Active   amLODipine  (NORVASC ) 10 MG tablet [id number] No Take 1 tablet (10 mg total) by mouth daily. Aisha Hove, MD Taking Active   aspirin EC 81 MG tablet [id number] No Take 81 mg by mouth daily. [provider] Taking Active Self  atorvastatin  (LIPITOR) 40 MG tablet [id number] No TAKE 1 TABLET BY MOUTH EVERY DAY Trenda Frisk, FNP Taking Active   Cinnamon 500 MG TABS [id number] No Take 500 mg by mouth daily. [provider] Taking Active   Cranberry 500 MG TABS [id number]  Take 500 mg by mouth daily. [provider]  Active   diltiazem  (CARDIZEM  LA) 120 MG 24 hr tablet [id number]  Take 1 tablet (120 mg total) by mouth daily. Aisha Hove, MD  Active   fluticasone  (  FLONASE ) 50 MCG/ACT nasal spray [id number] No Place 1 spray into both nostrils daily.  Patient not taking: Reported on 03/13/2024   Aisha Hove, MD Not Taking Expired 08/28/23 2359   glimepiride  (AMARYL ) 4 MG tablet [id number]  Take 1 tablet (4 mg total) by mouth 2 (two) times daily. Aisha Hove, MD  Active   glucose blood (ACCU-CHEK GUIDE TEST) test strip [id number]  Use with device to check sugars three times daily  Aisha Hove, MD  Active   levothyroxine  (SYNTHROID ) 137 MCG tablet [id number] No TAKE 1 TABLET BY MOUTH EVERYDAY IN THE MORNING ON AN EMPTY STOMACH, NO OTHER MEDS OR FOODS FOR AN HOUR Trenda Frisk, FNP Taking Active   losartan  (COZAAR ) 100 MG tablet [id number] No Take 1 tablet (100 mg total) by mouth daily. Trenda Frisk, FNP Taking Active   meclizine  (ANTIVERT ) 25 MG tablet [id number] No Take 1 tablet (25 mg total) by mouth every 6 (six) hours as needed for dizziness or nausea. Aisha Hove, MD Taking Active   Multiple Vitamin (MULTI-VITAMIN) tablet [id number] No Take 1 tablet by mouth every other day. [provider] Taking Active   Semaglutide ,0.25 or 0.5MG /DOS, 2 MG/3ML SOPN [id number] No Inject 0.25 mg into the skin once a week.  Patient not taking: Reported on 03/22/2024   Aisha Hove, MD Not Taking Active   sitaGLIPtin  (JANUVIA ) 100 MG tablet [id number] No Take 1 tablet (100 mg total) by mouth daily. Aisha Hove, MD Taking Active   Tetrahydrozoline HCl [facility] OP) [id number] No Place 1 drop into both eyes daily as needed (dry eyes).  Patient not taking: Reported on 03/22/2024   [provider] Not Taking Active Self  triamterene -hydrochlorothiazide  (MAXZIDE -25) 37.5-25 MG tablet [id number] No Take 1 tablet by mouth daily. Trenda Frisk, FNP Taking Active               Assessment/Plan:   Diabetes: - Currently uncontrolled (patient states she was out of glimepiride  for some time prior to A1c check) - Reviewed long term cardiovascular and renal outcomes of uncontrolled blood sugar - Reviewed goal A1c, goal fasting, and goal 2 hour post prandial glucose - Reviewed dietary modifications including low carb diet  -Counseled to check BG daily at varying times - Notified patient that Ozempic  has been approved, office will notify her once it arrives for pickup - Patient denies personal or family history of multiple endocrine neoplasia type 2, medullary  thyroid  cancer; personal history of pancreatitis or gallbladder disease. -Continue current medication therapy for now  Follow Up Plan: 1 month  Carnell Christian, PharmD Clinical Pharmacist [phone]

## 2024-04-12 ENCOUNTER — Telehealth: Payer: Self-pay

## 2024-04-12 NOTE — Progress Notes (Signed)
   04/12/2024  Patient ID: Erica Camacho, female   DOB: 07/27/1951, 73 y.o.   MRN: [id number]  Patient called to ask if she could stop taking the Amlodipine .   States she does not feel well when she takes this med (weak, light-headed) and decided not to take today. Does not have a way of checking her blood pressure at home at this time.   Notified PCP, okay with patient continuing to hold Amlodipine  but asks that patient schedules f/u appt to come into see her next week. Notified patient and she will contact office to schedule a visit.  Carnell Christian, PharmD Clinical Pharmacist [phone]

## 2024-04-24 ENCOUNTER — Ambulatory Visit: Admitting: Internal Medicine

## 2024-04-24 ENCOUNTER — Ambulatory Visit: Payer: Self-pay | Admitting: Internal Medicine

## 2024-04-24 ENCOUNTER — Encounter: Payer: Self-pay | Admitting: Internal Medicine

## 2024-04-24 VITALS — BP 164/88 | HR 90 | Ht 61.0 in | Wt 191.6 lb

## 2024-04-24 DIAGNOSIS — E039 Hypothyroidism, unspecified: Secondary | ICD-10-CM | POA: Diagnosis not present

## 2024-04-24 DIAGNOSIS — E1165 Type 2 diabetes mellitus with hyperglycemia: Secondary | ICD-10-CM

## 2024-04-24 DIAGNOSIS — Z794 Long term (current) use of insulin: Secondary | ICD-10-CM | POA: Diagnosis not present

## 2024-04-24 DIAGNOSIS — E782 Mixed hyperlipidemia: Secondary | ICD-10-CM

## 2024-04-24 DIAGNOSIS — E1169 Type 2 diabetes mellitus with other specified complication: Secondary | ICD-10-CM | POA: Diagnosis not present

## 2024-04-24 DIAGNOSIS — I152 Hypertension secondary to endocrine disorders: Secondary | ICD-10-CM | POA: Diagnosis not present

## 2024-04-24 DIAGNOSIS — E1159 Type 2 diabetes mellitus with other circulatory complications: Secondary | ICD-10-CM

## 2024-04-24 LAB — POCT CBG (FASTING - GLUCOSE)-MANUAL ENTRY: Glucose Fasting, POC: 453 mg/dL — AB (ref 70–99)

## 2024-04-24 MED ORDER — HYDRALAZINE HCL 50 MG PO TABS: 50.0000 mg | ORAL_TABLET | Freq: Two times a day (BID) | ORAL | 3 refills | Status: DC

## 2024-04-24 MED ORDER — LANTUS SOLOSTAR 100 UNIT/ML ~~LOC~~ SOPN: 20.0000 [IU] | PEN_INJECTOR | Freq: Every day | SUBCUTANEOUS | 11 refills | Status: DC

## 2024-04-24 NOTE — Progress Notes (Signed)
 Established Patient Office Visit  Subjective:  Patient ID: Erica Camacho, female    DOB: [birth year]  Age: 73 y.o. MRN: [id number]  Chief Complaint  Patient presents with   Follow-up    Patient comes in for her follow-up today.  Her fingerstick glucose is 453, admits to eating a big breakfast this morning.  Her most recent hemoglobin A1c has also jumped up to 10.0.  Patient reports that she is trying her best to control her diet but still has some issues.  She just received her Ozempic  injections and took the first dose today.  She is currently on glimepiride  and Januvia  as well.  She was unable to tolerate metformin, Farxiga  or Jardiance. In light of her very high hemoglobin A1c she agrees to start Lantus insulin  at bedtime.  Will send in a prescription to start at 20 units at night.  She is advised control her diet very strictly. She also complained of leg swelling due to amlodipine , and was advised to stop it.  Today her swelling is gone but her blood pressure is very high.  Will add hydralazine 50 mg twice a day. Patient requested to check her fingerstick glucose every morning and bring in her record.    No other concerns at this time.   Past Medical History:  Diagnosis Date   Anemia 02/04/2023   COPD (chronic obstructive pulmonary disease) (HCC)    Diabetes mellitus without complication (HCC)    Dyspnea    Heart murmur    Hyperlipidemia    Hypertension    Hypothyroidism    Thyroid  disease    Vertigo    Wheezing    OCCAS    Past Surgical History:  Procedure Laterality Date   CATARACT EXTRACTION W/PHACO Left 10/05/2018   Procedure: CATARACT EXTRACTION PHACO AND INTRAOCULAR LENS PLACEMENT (IOC);  Surgeon: Ola Berger, MD;  Location: [facility] ORS;  Service: Ophthalmology;  Laterality: Left;  US  00:46.5 CDE 8.29 Fluid Pack Lot # [id number]   CESAREAN SECTION     X 3   FRACTURE SURGERY      Social History   Socioeconomic History   Marital status: Single    Spouse name: Not  on file   Number of children: Not on file   Years of education: Not on file   Highest education level: Not on file  Occupational History   Not on file  Tobacco Use   Smoking status: Former   Smokeless tobacco: Never  Vaping Use   Vaping status: Never Used  Substance and Sexual Activity   Alcohol use: No   Drug use: No   Sexual activity: Not on file  Other Topics Concern   Not on file  Social History Narrative   Not on file   Social Drivers of Health   Financial Resource Strain: Not on file  Food Insecurity: Not on file  Transportation Needs: Not on file  Physical Activity: Not on file  Stress: Not on file  Social Connections: Not on file  Intimate Partner Violence: Not on file    History reviewed. No pertinent family history.  Allergies  Allergen Reactions   Aspirin Hives and Other (See Comments)    Allergic to aspirin that is not coated. Hives and GI upset   Ampicillin Hives   Carrot [Daucus Carota] Hives   Codeine Hives   Latex Itching   Penicillins Hives and Other (See Comments)    Has patient had a PCN reaction causing immediate rash, facial/tongue/throat swelling, SOB or  lightheadedness with hypotension: No Has patient had a PCN reaction causing severe rash involving mucus membranes or skin necrosis: Yes Has patient had a PCN reaction that required hospitalization: Yes Has patient had a PCN reaction occurring within the last 10 years: No If all of the above answers are "NO", then may proceed with Cephalosporin use.     Outpatient Medications Prior to Visit  Medication Sig   Accu-Chek FastClix Lancets MISC Use with device to check sugars three times daily   albuterol  (PROVENTIL ) (2.5 MG/3ML) 0.083% nebulizer solution Take 3 mLs (2.5 mg total) by nebulization every 4 (four) hours as needed for wheezing or shortness of breath.   Albuterol -Budesonide (AIRSUPRA ) 90-80 MCG/ACT AERO Inhale 1 puff into the lungs every 6 (six) hours as needed.   aspirin EC 81 MG  tablet Take 81 mg by mouth daily.   atorvastatin  (LIPITOR) 40 MG tablet TAKE 1 TABLET BY MOUTH EVERY DAY   Cinnamon 500 MG TABS Take 500 mg by mouth daily.   Cranberry 500 MG TABS Take 500 mg by mouth daily.   diltiazem  (CARDIZEM  LA) 120 MG 24 hr tablet Take 1 tablet (120 mg total) by mouth daily.   glimepiride  (AMARYL ) 4 MG tablet Take 1 tablet (4 mg total) by mouth 2 (two) times daily.   glucose blood (ACCU-CHEK GUIDE TEST) test strip Use with device to check sugars three times daily   levothyroxine  (SYNTHROID ) 137 MCG tablet TAKE 1 TABLET BY MOUTH EVERYDAY IN THE MORNING ON AN EMPTY STOMACH, NO OTHER MEDS OR FOODS FOR AN HOUR   losartan  (COZAAR ) 100 MG tablet Take 1 tablet (100 mg total) by mouth daily.   meclizine  (ANTIVERT ) 25 MG tablet Take 1 tablet (25 mg total) by mouth every 6 (six) hours as needed for dizziness or nausea.   Multiple Vitamin (MULTI-VITAMIN) tablet Take 1 tablet by mouth every other day.   sitaGLIPtin  (JANUVIA ) 100 MG tablet Take 1 tablet (100 mg total) by mouth daily.   Tetrahydrozoline HCl (VISINE OP) Place 1 drop into both eyes daily as needed (dry eyes).   triamterene -hydrochlorothiazide  (MAXZIDE -25) 37.5-25 MG tablet Take 1 tablet by mouth daily.   amLODipine  (NORVASC ) 10 MG tablet Take 1 tablet (10 mg total) by mouth daily. (Patient not taking: Reported on 04/24/2024)   fluticasone  (FLONASE ) 50 MCG/ACT nasal spray Place 1 spray into both nostrils daily. (Patient not taking: Reported on 03/13/2024)   Semaglutide ,0.25 or 0.5MG /DOS, 2 MG/3ML SOPN Inject 0.25 mg into the skin once a week. (Patient not taking: Reported on 04/24/2024)   No facility-administered medications prior to visit.    Review of Systems  Constitutional: Negative.  Negative for chills and fever.  HENT: Negative.  Negative for sinus pain and sore throat.   Eyes: Negative.   Respiratory: Negative.  Negative for cough and shortness of breath.   Cardiovascular: Negative.  Negative for chest pain,  palpitations and leg swelling.  Gastrointestinal: Negative.  Negative for abdominal pain, constipation, diarrhea, heartburn, nausea and vomiting.  Genitourinary: Negative.  Negative for dysuria and flank pain.  Musculoskeletal: Negative.  Negative for joint pain and myalgias.  Skin: Negative.   Neurological: Negative.  Negative for dizziness and headaches.  Endo/Heme/Allergies: Negative.   Psychiatric/Behavioral: Negative.  Negative for depression and suicidal ideas. The patient is not nervous/anxious.        Objective:   BP (!) 164/88   Pulse 90   Ht 5\' 1"  (1.549 m)   Wt 191 lb 9.6 oz (86.9 kg)  SpO2 97%   BMI 36.20 kg/m   Vitals:   04/24/24 1043  BP: (!) 164/88  Pulse: 90  Height: 5\' 1"  (1.549 m)  Weight: 191 lb 9.6 oz (86.9 kg)  SpO2: 97%  BMI (Calculated): 36.22    Physical Exam Vitals and nursing note reviewed.  Constitutional:      Appearance: Normal appearance.  HENT:     Head: Normocephalic and atraumatic.     Nose: Nose normal.     Mouth/Throat:     Mouth: Mucous membranes are moist.     Pharynx: Oropharynx is clear.  Eyes:     Conjunctiva/sclera: Conjunctivae normal.     Pupils: Pupils are equal, round, and reactive to light.  Cardiovascular:     Rate and Rhythm: Normal rate and regular rhythm.     Pulses: Normal pulses.     Heart sounds: Normal heart sounds. No murmur heard. Pulmonary:     Effort: Pulmonary effort is normal.     Breath sounds: Normal breath sounds. No wheezing.  Abdominal:     General: Bowel sounds are normal.     Palpations: Abdomen is soft.     Tenderness: There is no abdominal tenderness. There is no right CVA tenderness or left CVA tenderness.  Musculoskeletal:        General: Normal range of motion.     Cervical back: Normal range of motion.     Right lower leg: No edema.     Left lower leg: No edema.  Skin:    General: Skin is warm and dry.  Neurological:     General: No focal deficit present.     Mental Status: She  is alert and oriented to person, place, and time.  Psychiatric:        Mood and Affect: Mood normal.        Behavior: Behavior normal.      Results for orders placed or performed in visit on 04/24/24  POCT CBG (Fasting - Glucose)  Result Value Ref Range   Glucose Fasting, POC 453 (A) 70 - 99 mg/dL    Recent Results (from the past 2160 hours)  POCT CBG (Fasting - Glucose)     Status: Abnormal   Collection Time: 03/13/24 11:10 AM  Result Value Ref Range   Glucose Fasting, POC 253 (A) 70 - 99 mg/dL  POC CREATINE & ALBUMIN,URINE     Status: Abnormal   Collection Time: 03/13/24 11:39 AM  Result Value Ref Range   Microalbumin Ur, POC 150 mg/L   Creatinine, POC 200 mg/dL   Albumin/Creatinine Ratio, Urine, POC >300   CBC with Diff     Status: Abnormal   Collection Time: 03/13/24 11:51 AM  Result Value Ref Range   WBC 7.5 3.4 - 10.8 x10E3/uL   RBC 4.04 3.77 - 5.28 x10E6/uL   Hemoglobin 13.2 11.1 - 15.9 g/dL   Hematocrit 52.8 41.3 - 46.6 %   MCV 101 (H) 79 - 97 fL   MCH 32.7 26.6 - 33.0 pg   MCHC 32.4 31.5 - 35.7 g/dL   RDW 24.4 01.0 - 27.2 %   Platelets 239 150 - 450 x10E3/uL   Neutrophils 38 Not Estab. %   Lymphs 42 Not Estab. %   Monocytes 11 Not Estab. %   Eos 8 Not Estab. %   Basos 1 Not Estab. %   Neutrophils Absolute 2.8 1.4 - 7.0 x10E3/uL   Lymphocytes Absolute 3.2 (H) 0.7 - 3.1 x10E3/uL   Monocytes Absolute  0.8 0.1 - 0.9 x10E3/uL   EOS (ABSOLUTE) 0.6 (H) 0.0 - 0.4 x10E3/uL   Basophils Absolute 0.1 0.0 - 0.2 x10E3/uL   Immature Granulocytes 0 Not Estab. %   Immature Grans (Abs) 0.0 0.0 - 0.1 x10E3/uL  CMP14+EGFR     Status: Abnormal   Collection Time: 03/13/24 11:51 AM  Result Value Ref Range   Glucose 78 70 - 99 mg/dL   BUN 9 8 - 27 mg/dL   Creatinine, Ser 7.42 0.57 - 1.00 mg/dL   eGFR 93 >59 DG/LOV/5.64   BUN/Creatinine Ratio 13 12 - 28   Sodium 142 134 - 144 mmol/L   Potassium 4.1 3.5 - 5.2 mmol/L   Chloride 107 (H) 96 - 106 mmol/L   CO2 21 20 - 29 mmol/L    Calcium  9.0 8.7 - 10.3 mg/dL   Total Protein 6.2 6.0 - 8.5 g/dL   Albumin 3.9 3.8 - 4.8 g/dL   Globulin, Total 2.3 1.5 - 4.5 g/dL   Bilirubin Total 0.8 0.0 - 1.2 mg/dL   Alkaline Phosphatase 80 44 - 121 IU/L   AST 21 0 - 40 IU/L   ALT 9 0 - 32 IU/L  TSH+T4F+T3Free     Status: Abnormal   Collection Time: 03/13/24 11:51 AM  Result Value Ref Range   TSH 4.590 (H) 0.450 - 4.500 uIU/mL   T3, Free 2.5 2.0 - 4.4 pg/mL   Free T4 1.08 0.82 - 1.77 ng/dL  Lipid Panel w/o Chol/HDL Ratio     Status: None   Collection Time: 03/13/24 11:51 AM  Result Value Ref Range   Cholesterol, Total 131 100 - 199 mg/dL   Triglycerides 72 0 - 149 mg/dL   HDL 54 >33 mg/dL   VLDL Cholesterol Cal 15 5 - 40 mg/dL   LDL Chol Calc (NIH) 62 0 - 99 mg/dL  Hemoglobin I9J     Status: None   Collection Time: 03/13/24 11:51 AM  Result Value Ref Range   Hgb A1c MFr Bld 4.9 4.8 - 5.6 %    Comment:          Prediabetes: 5.7 - 6.4          Diabetes: >6.4          Glycemic control for adults with diabetes: <7.0    Est. average glucose Bld gHb Est-mCnc 94 mg/dL  POCT CBG (Fasting - Glucose)     Status: Abnormal   Collection Time: 03/22/24 10:50 AM  Result Value Ref Range   Glucose Fasting, POC 287 (A) 70 - 99 mg/dL  Hemoglobin J8A     Status: Abnormal   Collection Time: 03/22/24 11:22 AM  Result Value Ref Range   Hgb A1c MFr Bld 10.0 (H) 4.8 - 5.6 %    Comment:          Prediabetes: 5.7 - 6.4          Diabetes: >6.4          Glycemic control for adults with diabetes: <7.0    Est. average glucose Bld gHb Est-mCnc 240 mg/dL  POCT CBG (Fasting - Glucose)     Status: Abnormal   Collection Time: 04/24/24 10:48 AM  Result Value Ref Range   Glucose Fasting, POC 453 (A) 70 - 99 mg/dL      Assessment & Plan:  Start Lantus at bedtime.  Continue glimepiride , Januvia , Ozempic  injections. Also add hydralazine for blood pressure control. Problem List Items Addressed This Visit     Type  2 diabetes mellitus with  hyperglycemia, without long-term current use of insulin  (HCC) - Primary   Relevant Medications   insulin  glargine (LANTUS SOLOSTAR) 100 UNIT/ML Solostar Pen   Hypothyroidism   Combined hyperlipidemia associated with type 2 diabetes mellitus (HCC)   Relevant Medications   hydrALAZINE (APRESOLINE) 50 MG tablet   insulin  glargine (LANTUS SOLOSTAR) 100 UNIT/ML Solostar Pen   Hypertension associated with diabetes (HCC)   Relevant Medications   hydrALAZINE (APRESOLINE) 50 MG tablet   insulin  glargine (LANTUS SOLOSTAR) 100 UNIT/ML Solostar Pen    Return in about 2 weeks (around 05/08/2024).   Total time spent: 30 minutes  Aisha Hove, MD  04/24/2024   This document may have been prepared by [facility] Voice Recognition software and as such may include unintentional dictation errors.

## 2024-05-01 ENCOUNTER — Telehealth: Payer: Self-pay

## 2024-05-01 NOTE — Telephone Encounter (Signed)
 Gave pt a call to let her know we will be mail out PAP [employer] (Lantus ),pt is aware and will mail back once she received. Will follow up.we have faxed provider portion today.

## 2024-05-01 NOTE — Progress Notes (Signed)
   05/01/2024  Patient ID: Regino Caprio, female   DOB: 1951/05/30, 73 y.o.   MRN: [id number]  Patient called in stating that she cannot afford the copay on her lantus  long-term. Should qualify for PAP through [employer], will coordinate with med access team to mail application at patient request.  Follow Up: 2 weeks  Carnell Christian, PharmD Clinical Pharmacist [phone]

## 2024-05-08 ENCOUNTER — Ambulatory Visit: Admitting: Internal Medicine

## 2024-05-10 ENCOUNTER — Ambulatory Visit: Payer: Self-pay | Admitting: Internal Medicine

## 2024-05-10 ENCOUNTER — Ambulatory Visit: Admitting: Internal Medicine

## 2024-05-10 ENCOUNTER — Encounter: Payer: Self-pay | Admitting: Internal Medicine

## 2024-05-10 VITALS — BP 162/88 | HR 103 | Ht 61.0 in | Wt 192.0 lb

## 2024-05-10 DIAGNOSIS — Z794 Long term (current) use of insulin: Secondary | ICD-10-CM

## 2024-05-10 DIAGNOSIS — I152 Hypertension secondary to endocrine disorders: Secondary | ICD-10-CM | POA: Diagnosis not present

## 2024-05-10 DIAGNOSIS — E039 Hypothyroidism, unspecified: Secondary | ICD-10-CM | POA: Diagnosis not present

## 2024-05-10 DIAGNOSIS — E1169 Type 2 diabetes mellitus with other specified complication: Secondary | ICD-10-CM | POA: Diagnosis not present

## 2024-05-10 DIAGNOSIS — E1159 Type 2 diabetes mellitus with other circulatory complications: Secondary | ICD-10-CM | POA: Diagnosis not present

## 2024-05-10 DIAGNOSIS — E782 Mixed hyperlipidemia: Secondary | ICD-10-CM | POA: Diagnosis not present

## 2024-05-10 DIAGNOSIS — E1165 Type 2 diabetes mellitus with hyperglycemia: Secondary | ICD-10-CM

## 2024-05-10 LAB — POCT CBG (FASTING - GLUCOSE)-MANUAL ENTRY: Glucose Fasting, POC: 261 mg/dL — AB (ref 70–99)

## 2024-05-10 MED ORDER — LANTUS SOLOSTAR 100 UNIT/ML ~~LOC~~ SOPN: 20.0000 [IU] | PEN_INJECTOR | Freq: Every day | SUBCUTANEOUS | 2 refills | Status: AC

## 2024-05-10 MED ORDER — HYDRALAZINE HCL 100 MG PO TABS: 100.0000 mg | ORAL_TABLET | Freq: Four times a day (QID) | ORAL | 11 refills | Status: AC

## 2024-05-10 NOTE — Progress Notes (Signed)
 Established Patient Office Visit  Subjective:  Patient ID: Erica Camacho, female    DOB: 05-25-1951  Age: 73 y.o. MRN: [id number]  Chief Complaint  Patient presents with   Follow-up    Discuss medications    Patient comes in for her follow-up diabetes accompanied by her friend.  Her blood pressure is also high.  Patient reports that she cannot afford the Lantus  insulin  and wants to continue her Januvia , glimepiride , and the Ozempic  samples that were given to her.  Patient was explained that her hemoglobin A1c was very high at 10 and she will need to start an insulin  regimen.  Will check with the pharmacy as to what is the least expensive insulin  available but as per her plan.  Meanwhile she will continue her current medications.  To increase her Ozempic  to 0.5 mg/week. Her blood pressure is high on medications.  Will increase her hydralazine  to 100 mg twice a day.  Patient is noncompliant with her diet, but promises to increase her diet control.    No other concerns at this time.   Past Medical History:  Diagnosis Date   Anemia 02/04/2023   COPD (chronic obstructive pulmonary disease) (HCC)    Diabetes mellitus without complication (HCC)    Dyspnea    Heart murmur    Hyperlipidemia    Hypertension    Hypothyroidism    Thyroid  disease    Vertigo    Wheezing    OCCAS    Past Surgical History:  Procedure Laterality Date   CATARACT EXTRACTION W/PHACO Left 10/05/2018   Procedure: CATARACT EXTRACTION PHACO AND INTRAOCULAR LENS PLACEMENT (IOC);  Surgeon: Ola Berger, MD;  Location: [facility] ORS;  Service: Ophthalmology;  Laterality: Left;  US  00:46.5 CDE 8.29 Fluid Pack Lot # [id number]   CESAREAN SECTION     X 3   FRACTURE SURGERY      Social History   Socioeconomic History   Marital status: Single    Spouse name: Not on file   Number of children: Not on file   Years of education: Not on file   Highest education level: Not on file  Occupational History   Not on file   Tobacco Use   Smoking status: Former   Smokeless tobacco: Never  Vaping Use   Vaping status: Never Used  Substance and Sexual Activity   Alcohol use: No   Drug use: No   Sexual activity: Not on file  Other Topics Concern   Not on file  Social History Narrative   Not on file   Social Drivers of Health   Financial Resource Strain: Not on file  Food Insecurity: Not on file  Transportation Needs: Not on file  Physical Activity: Not on file  Stress: Not on file  Social Connections: Not on file  Intimate Partner Violence: Not on file    History reviewed. No pertinent family history.  Allergies  Allergen Reactions   Aspirin Hives and Other (See Comments)    Allergic to aspirin that is not coated. Hives and GI upset   Ampicillin Hives   Carrot [Daucus Carota] Hives   Codeine Hives   Latex Itching   Penicillins Hives and Other (See Comments)    Has patient had a PCN reaction causing immediate rash, facial/tongue/throat swelling, SOB or lightheadedness with hypotension: No Has patient had a PCN reaction causing severe rash involving mucus membranes or skin necrosis: Yes Has patient had a PCN reaction that required hospitalization: Yes Has patient had a  PCN reaction occurring within the last 10 years: No If all of the above answers are NO, then may proceed with Cephalosporin use.     Outpatient Medications Prior to Visit  Medication Sig   Accu-Chek FastClix Lancets MISC Use with device to check sugars three times daily   albuterol  (PROVENTIL ) (2.5 MG/3ML) 0.083% nebulizer solution Take 3 mLs (2.5 mg total) by nebulization every 4 (four) hours as needed for wheezing or shortness of breath.   Albuterol -Budesonide (AIRSUPRA ) 90-80 MCG/ACT AERO Inhale 1 puff into the lungs every 6 (six) hours as needed.   aspirin EC 81 MG tablet Take 81 mg by mouth daily.   atorvastatin  (LIPITOR) 40 MG tablet TAKE 1 TABLET BY MOUTH EVERY DAY   Cinnamon 500 MG TABS Take 500 mg by mouth daily.    Cranberry 500 MG TABS Take 500 mg by mouth daily.   diltiazem  (CARDIZEM  LA) 120 MG 24 hr tablet Take 1 tablet (120 mg total) by mouth daily.   glimepiride  (AMARYL ) 4 MG tablet Take 1 tablet (4 mg total) by mouth 2 (two) times daily.   glucose blood (ACCU-CHEK GUIDE TEST) test strip Use with device to check sugars three times daily   levothyroxine  (SYNTHROID ) 137 MCG tablet TAKE 1 TABLET BY MOUTH EVERYDAY IN THE MORNING ON AN EMPTY STOMACH, NO OTHER MEDS OR FOODS FOR AN HOUR   losartan  (COZAAR ) 100 MG tablet Take 1 tablet (100 mg total) by mouth daily.   Multiple Vitamin (MULTI-VITAMIN) tablet Take 1 tablet by mouth every other day.   Semaglutide ,0.25 or 0.5MG /DOS, 2 MG/3ML SOPN Inject 0.25 mg into the skin once a week.   sitaGLIPtin  (JANUVIA ) 100 MG tablet Take 1 tablet (100 mg total) by mouth daily.   triamterene -hydrochlorothiazide  (MAXZIDE -25) 37.5-25 MG tablet Take 1 tablet by mouth daily.   [DISCONTINUED] hydrALAZINE  (APRESOLINE ) 50 MG tablet Take 1 tablet (50 mg total) by mouth in the morning and at bedtime.   fluticasone  (FLONASE ) 50 MCG/ACT nasal spray Place 1 spray into both nostrils daily. (Patient not taking: Reported on 05/10/2024)   [DISCONTINUED] amLODipine  (NORVASC ) 10 MG tablet Take 1 tablet (10 mg total) by mouth daily. (Patient not taking: Reported on 05/10/2024)   [DISCONTINUED] insulin  glargine (LANTUS  SOLOSTAR) 100 UNIT/ML Solostar Pen Inject 20 Units into the skin at bedtime. (Patient not taking: Reported on 05/10/2024)   [DISCONTINUED] meclizine  (ANTIVERT ) 25 MG tablet Take 1 tablet (25 mg total) by mouth every 6 (six) hours as needed for dizziness or nausea. (Patient not taking: Reported on 05/10/2024)   [DISCONTINUED] Tetrahydrozoline HCl (VISINE OP) Place 1 drop into both eyes daily as needed (dry eyes). (Patient not taking: Reported on 05/10/2024)   No facility-administered medications prior to visit.    Review of Systems  Constitutional: Negative.  Negative for chills,  fever, malaise/fatigue and weight loss.  HENT: Negative.  Negative for sore throat.   Eyes: Negative.   Respiratory: Negative.  Negative for cough and shortness of breath.   Cardiovascular: Negative.  Negative for chest pain, palpitations and leg swelling.  Gastrointestinal: Negative.  Negative for abdominal pain, constipation, diarrhea, heartburn, nausea and vomiting.  Genitourinary: Negative.  Negative for dysuria and flank pain.  Musculoskeletal: Negative.  Negative for joint pain and myalgias.  Skin: Negative.   Neurological: Negative.  Negative for dizziness, tingling, tremors and headaches.  Endo/Heme/Allergies: Negative.   Psychiatric/Behavioral: Negative.  Negative for depression and suicidal ideas. The patient is not nervous/anxious.        Objective:   BP Aaron Aas)  162/88   Pulse (!) 103   Ht 5' 1 (1.549 m)   Wt 192 lb (87.1 kg)   SpO2 96%   BMI 36.28 kg/m   Vitals:   05/10/24 1416  BP: (!) 162/88  Pulse: (!) 103  Height: 5' 1 (1.549 m)  Weight: 192 lb (87.1 kg)  SpO2: 96%  BMI (Calculated): 36.3    Physical Exam Vitals and nursing note reviewed.  Constitutional:      Appearance: Normal appearance.  HENT:     Head: Normocephalic and atraumatic.     Nose: Nose normal.     Mouth/Throat:     Mouth: Mucous membranes are moist.     Pharynx: Oropharynx is clear.   Eyes:     Conjunctiva/sclera: Conjunctivae normal.     Pupils: Pupils are equal, round, and reactive to light.    Cardiovascular:     Rate and Rhythm: Normal rate and regular rhythm.     Pulses: Normal pulses.     Heart sounds: Normal heart sounds. No murmur heard. Pulmonary:     Effort: Pulmonary effort is normal.     Breath sounds: Normal breath sounds. No wheezing.  Abdominal:     General: Bowel sounds are normal.     Palpations: Abdomen is soft.     Tenderness: There is no abdominal tenderness. There is no right CVA tenderness or left CVA tenderness.   Musculoskeletal:        General:  Normal range of motion.     Cervical back: Normal range of motion.     Right lower leg: No edema.     Left lower leg: No edema.   Skin:    General: Skin is warm and dry.   Neurological:     General: No focal deficit present.     Mental Status: She is alert and oriented to person, place, and time.   Psychiatric:        Mood and Affect: Mood normal.        Behavior: Behavior normal.      Results for orders placed or performed in visit on 05/10/24  POCT CBG (Fasting - Glucose)  Result Value Ref Range   Glucose Fasting, POC 261 (A) 70 - 99 mg/dL    Recent Results (from the past 2160 hours)  POCT CBG (Fasting - Glucose)     Status: Abnormal   Collection Time: 03/13/24 11:10 AM  Result Value Ref Range   Glucose Fasting, POC 253 (A) 70 - 99 mg/dL  POC CREATINE & ALBUMIN,URINE     Status: Abnormal   Collection Time: 03/13/24 11:39 AM  Result Value Ref Range   Microalbumin Ur, POC 150 mg/L   Creatinine, POC 200 mg/dL   Albumin/Creatinine Ratio, Urine, POC >300   CBC with Diff     Status: Abnormal   Collection Time: 03/13/24 11:51 AM  Result Value Ref Range   WBC 7.5 3.4 - 10.8 x10E3/uL   RBC 4.04 3.77 - 5.28 x10E6/uL   Hemoglobin 13.2 11.1 - 15.9 g/dL   Hematocrit 22.2 97.9 - 46.6 %   MCV 101 (H) 79 - 97 fL   MCH 32.7 26.6 - 33.0 pg   MCHC 32.4 31.5 - 35.7 g/dL   RDW 89.2 11.9 - 41.7 %   Platelets 239 150 - 450 x10E3/uL   Neutrophils 38 Not Estab. %   Lymphs 42 Not Estab. %   Monocytes 11 Not Estab. %   Eos 8 Not Estab. %  Basos 1 Not Estab. %   Neutrophils Absolute 2.8 1.4 - 7.0 x10E3/uL   Lymphocytes Absolute 3.2 (H) 0.7 - 3.1 x10E3/uL   Monocytes Absolute 0.8 0.1 - 0.9 x10E3/uL   EOS (ABSOLUTE) 0.6 (H) 0.0 - 0.4 x10E3/uL   Basophils Absolute 0.1 0.0 - 0.2 x10E3/uL   Immature Granulocytes 0 Not Estab. %   Immature Grans (Abs) 0.0 0.0 - 0.1 x10E3/uL  CMP14+EGFR     Status: Abnormal   Collection Time: 03/13/24 11:51 AM  Result Value Ref Range   Glucose 78 70 -  99 mg/dL   BUN 9 8 - 27 mg/dL   Creatinine, Ser 5.40 0.57 - 1.00 mg/dL   eGFR 93 >98 JX/BJY/7.82   BUN/Creatinine Ratio 13 12 - 28   Sodium 142 134 - 144 mmol/L   Potassium 4.1 3.5 - 5.2 mmol/L   Chloride 107 (H) 96 - 106 mmol/L   CO2 21 20 - 29 mmol/L   Calcium  9.0 8.7 - 10.3 mg/dL   Total Protein 6.2 6.0 - 8.5 g/dL   Albumin 3.9 3.8 - 4.8 g/dL   Globulin, Total 2.3 1.5 - 4.5 g/dL   Bilirubin Total 0.8 0.0 - 1.2 mg/dL   Alkaline Phosphatase 80 44 - 121 IU/L   AST 21 0 - 40 IU/L   ALT 9 0 - 32 IU/L  TSH+T4F+T3Free     Status: Abnormal   Collection Time: 03/13/24 11:51 AM  Result Value Ref Range   TSH 4.590 (H) 0.450 - 4.500 uIU/mL   T3, Free 2.5 2.0 - 4.4 pg/mL   Free T4 1.08 0.82 - 1.77 ng/dL  Lipid Panel w/o Chol/HDL Ratio     Status: None   Collection Time: 03/13/24 11:51 AM  Result Value Ref Range   Cholesterol, Total 131 100 - 199 mg/dL   Triglycerides 72 0 - 149 mg/dL   HDL 54 >95 mg/dL   VLDL Cholesterol Cal 15 5 - 40 mg/dL   LDL Chol Calc (NIH) 62 0 - 99 mg/dL  Hemoglobin A2Z     Status: None   Collection Time: 03/13/24 11:51 AM  Result Value Ref Range   Hgb A1c MFr Bld 4.9 4.8 - 5.6 %    Comment:          Prediabetes: 5.7 - 6.4          Diabetes: >6.4          Glycemic control for adults with diabetes: <7.0    Est. average glucose Bld gHb Est-mCnc 94 mg/dL  POCT CBG (Fasting - Glucose)     Status: Abnormal   Collection Time: 03/22/24 10:50 AM  Result Value Ref Range   Glucose Fasting, POC 287 (A) 70 - 99 mg/dL  Hemoglobin H0Q     Status: Abnormal   Collection Time: 03/22/24 11:22 AM  Result Value Ref Range   Hgb A1c MFr Bld 10.0 (H) 4.8 - 5.6 %    Comment:          Prediabetes: 5.7 - 6.4          Diabetes: >6.4          Glycemic control for adults with diabetes: <7.0    Est. average glucose Bld gHb Est-mCnc 240 mg/dL  POCT CBG (Fasting - Glucose)     Status: Abnormal   Collection Time: 04/24/24 10:48 AM  Result Value Ref Range   Glucose Fasting, POC  453 (A) 70 - 99 mg/dL  POCT CBG (Fasting - Glucose)  Status: Abnormal   Collection Time: 05/10/24  2:23 PM  Result Value Ref Range   Glucose Fasting, POC 261 (A) 70 - 99 mg/dL      Assessment & Plan:  Increase the dose of hydralazine  to 100 mg twice a day.  Continue other medications.  Monitor blood pressure.  Patient will continue her current diabetic medications.  Increase Ozempic  to 0.5 mg/week.  Will check with the pharmacy as to get approval for her basal insulin . Problem List Items Addressed This Visit     Essential hypertension, benign - Primary   Relevant Medications   hydrALAZINE  (APRESOLINE ) 100 MG tablet   Hypothyroidism   Combined hyperlipidemia associated with type 2 diabetes mellitus (HCC)   Relevant Medications   hydrALAZINE  (APRESOLINE ) 100 MG tablet   insulin  glargine (LANTUS  SOLOSTAR) 100 UNIT/ML Solostar Pen   Other Visit Diagnoses       Type 2 diabetes mellitus with hyperglycemia, with long-term current use of insulin  (HCC)       Relevant Medications   insulin  glargine (LANTUS  SOLOSTAR) 100 UNIT/ML Solostar Pen   Other Relevant Orders   POCT CBG (Fasting - Glucose) (Completed)       Return in about 1 month (around 06/09/2024).   Total time spent: 30 minutes  Aisha Hove, MD  05/10/2024   This document may have been prepared by [facility] Voice Recognition software and as such may include unintentional dictation errors.

## 2024-05-14 NOTE — Telephone Encounter (Signed)
 Received provider portion back ,waiting on pt portion to mail back.to us .

## 2024-05-15 ENCOUNTER — Other Ambulatory Visit: Payer: Self-pay

## 2024-05-15 NOTE — Progress Notes (Unsigned)
   05/15/2024  Patient ID: Erica Camacho, female   DOB: 1951/06/28, 74 y.o.   MRN: [id number]  Attempted to contact patient for scheduled appointment for medication management. Left HIPAA compliant message for patient to return my call at their convenience.   Carnell Christian, PharmD Clinical Pharmacist [phone]

## 2024-05-16 ENCOUNTER — Other Ambulatory Visit: Payer: Self-pay | Admitting: Family

## 2024-05-16 ENCOUNTER — Telehealth: Payer: Self-pay

## 2024-05-16 ENCOUNTER — Other Ambulatory Visit: Payer: Self-pay | Admitting: Internal Medicine

## 2024-05-16 DIAGNOSIS — E1165 Type 2 diabetes mellitus with hyperglycemia: Secondary | ICD-10-CM

## 2024-05-16 NOTE — Telephone Encounter (Signed)
 Gave pt a call to follow up on mail application SONOFI (Lantus ) spoke with pt said she still has not had a chance to fill it out but as soon as she gets a chance will mail back.

## 2024-05-16 NOTE — Progress Notes (Signed)
   05/16/2024  Patient ID: Erica Camacho, female   DOB: [birth year], 73 y.o.   MRN: [id number]  Patient called in stating she is out of her glimepiride  as of today and is requesting a refill be sent to [facility].  Made patient aware that [facility], contacted [facility] and they can [facility] for patient today. She is aware and will pick up.  Also requested to r/s patient's missed follow up for in office review of meds and BG again, patient states she cannot schedule at this time but will call back.  Carnell Christian, PharmD Clinical Pharmacist [phone]

## 2024-05-22 ENCOUNTER — Telehealth: Payer: Self-pay

## 2024-05-22 NOTE — Progress Notes (Signed)
   05/22/2024  Patient ID: Erica Camacho, female   DOB: 1951/08/01, 73 y.o.   MRN: [id number]  Attempted to contact patient to coordinate a follow up appointment to review BG and provide lantus  samples. No answer and unable to leave voicemail. Will continue to attempt to reach.  Jon VEAR Lindau, PharmD Clinical Pharmacist [phone]

## 2024-05-25 ENCOUNTER — Telehealth: Payer: Self-pay

## 2024-05-25 NOTE — Progress Notes (Signed)
   05/25/2024  Patient ID: Erica Camacho, female   DOB: 08-09-1951, 73 y.o.   MRN: [id number]  Attempted to contact patient to coordinate a follow up appointment to review BG and provide lantus  samples. Left voicemail asking for return call. Will continue to attempt to reach.  Jon VEAR Lindau, PharmD Clinical Pharmacist [phone]

## 2024-05-28 ENCOUNTER — Telehealth: Payer: Self-pay

## 2024-05-28 NOTE — Progress Notes (Addendum)
   05/28/2024  Patient ID: Erica Camacho, female   DOB: [birth year], 73 y.o.   MRN: [id number]  Attempted to contact patient to coordinate a follow up appointment to review BG and provide lantus  samples..  Patient picked up but declines to schedule a follow up in office at this time, states it is a busy summer for her. Scheduled a follow up call for 06/05/24 at 11am.    Jon VEAR Lindau, PharmD Clinical Pharmacist [phone]

## 2024-05-30 ENCOUNTER — Telehealth: Payer: Self-pay

## 2024-05-30 DIAGNOSIS — E1165 Type 2 diabetes mellitus with hyperglycemia: Secondary | ICD-10-CM

## 2024-05-30 NOTE — Progress Notes (Signed)
   05/30/2024  Patient ID: Erica Camacho, female   DOB: [birth year], 73 y.o.   MRN: [id number]  Patient called in stating she wasn't sure if she wanted to stay on the Ozempic . States she had read stuff on the internet about face disfigurement. Counseled patient on expected side effects of ozempic , patient will continue with medication.  Patient has yet to pick up lantus  samples from office. States she is having transport issues. Will place referral with social work to address.   Follow Up: Keep scheduled appt on 06/05/24  Jon VEAR Lindau, PharmD Clinical Pharmacist [phone]

## 2024-05-31 ENCOUNTER — Telehealth: Payer: Self-pay

## 2024-05-31 NOTE — Telephone Encounter (Signed)
 Gave pt a call to follow up on pt's application Sonofi (Lantus ),pt did not answer,tried reaching out 3x today,pt kept hanging.will follow up.

## 2024-05-31 NOTE — Progress Notes (Signed)
 Complex Care Management Note Care Guide Note  05/31/2024 Name: Erica Camacho MRN: [id number] DOB: [birth year]   Complex Care Management Outreach Attempts: An unsuccessful telephone outreach was attempted today to offer the patient information about available complex care management services.  Follow Up Plan:  Additional outreach attempts will be made to offer the patient complex care management information and services.   Encounter Outcome:  No Answer  Areebah Meinders [facility]  [facility] Guide Direct Dial: [phone]  Fax: [phone] Website: [email or web address]

## 2024-06-05 ENCOUNTER — Other Ambulatory Visit: Payer: Self-pay

## 2024-06-05 ENCOUNTER — Telehealth: Payer: Self-pay

## 2024-06-05 NOTE — Telephone Encounter (Signed)
 Gave pt a call to follow up on mail application [employer] (Lantus ), spoke with pt explain she has not had a chance to mail back ask pt if she can bring it to the providers office when picking up samples,pt will bring pap and will ask for [facility] angela and will leave pap to be scan.

## 2024-06-05 NOTE — Progress Notes (Signed)
 Complex Care Management Note Care Guide Note  06/05/2024 Name: Erica Camacho MRN: [id number] DOB: 1951-05-24   Complex Care Management Outreach Attempts: A second unsuccessful outreach was attempted today to offer the patient with information about available complex care management services.  Follow Up Plan:  Additional outreach attempts will be made to offer the patient complex care management information and services.   Encounter Outcome:  No Answer  Dio Giller [facility]  [facility] Guide Direct Dial: [phone]  Fax: [phone] Website: [email or web address]

## 2024-06-07 ENCOUNTER — Telehealth: Payer: Self-pay

## 2024-06-07 NOTE — Progress Notes (Signed)
 Complex Care Management Note Care Guide Note  06/07/2024 Name: Erica Camacho MRN: [id number] DOB: 1951-11-15   Complex Care Management Outreach Attempts: A third unsuccessful outreach was attempted today to offer the patient with information about available complex care management services.  Follow Up Plan:  No further outreach attempts will be made at this time. We have been unable to contact the patient to offer or enroll patient in complex care management services.  Encounter Outcome:  No Answer  Wenda Vanschaick [facility]  [facility] Guide Direct Dial: [phone]  Fax: [phone] Website: [email or web address]

## 2024-06-08 ENCOUNTER — Other Ambulatory Visit: Payer: Self-pay

## 2024-06-08 NOTE — Progress Notes (Signed)
   06/08/2024  Patient ID: Erica Camacho, female   DOB: May 21, 1951, 73 y.o.   MRN: [id number]  Contacted patient for scheduled follow up appointment for diabetes.  Patient currently taking Ozempic  0.25mg  every Tuesday, has given herself 3 doses so far.  Also taking Januvia  100mg  and Glimepride 4mg  twice daily.  Patient was also prescribed lantus  but has yet to pick up and has not started. Previously stated cost was a concern but now states she does not want to use insulin .  Patient's primary concern today is she is feeling very nauseous. Also reports checking BG twice daily and in the last week has noticed low blood sugars in the 60s.   Counseled patient for need to come into the office to see PCP and she declines due to transport issues. Of note, social work has tried numerous times to reach out to patient to assist with transport issues and have not been able to reach her.  Scheduled a home visit with patient for 06/12/24. Will go out and review sugars and medications. Plan to bring lantus  samples also so patient can start if sugars have elevated. Patient states she is tired of taking Januvia  and plans to hold until our appt on Tuesday.  Will also check BP as HTN has been concern.  Will also bring transport assistance paperwork with me and assist patient with filling out.  Jon VEAR Lindau, PharmD Clinical Pharmacist [phone]

## 2024-06-11 NOTE — Telephone Encounter (Signed)
 Spoke with pt said she will be bring in pap to aptm office this week with [facility] Angela.

## 2024-06-12 ENCOUNTER — Other Ambulatory Visit: Payer: Self-pay

## 2024-06-12 DIAGNOSIS — E1165 Type 2 diabetes mellitus with hyperglycemia: Secondary | ICD-10-CM

## 2024-06-12 NOTE — Progress Notes (Signed)
 06/12/2024 Name: Erica Camacho MRN: [id number] DOB: [birth year]  Chief Complaint  Patient presents with   Medication Management   Diabetes   Hypertension    Erica Camacho is a 73 y.o. year old female who presented for an in-home visit.   They were referred to the pharmacist by a quality report for assistance in managing diabetes.    Subjective:  Care Team: Primary Care Provider: Fernand Fredy RAMAN, MD ; Next Scheduled Visit: 08/10/24  Medication Access/Adherence  Current Pharmacy:  [id number] - [place], [place] - [address] [place] [address] Phone: [phone] Fax: [phone]  [facility] - [place], [place] - [address] [place] [address] Phone: [phone] Fax: [phone]   Patient reports affordability concerns with their medications: No  Patient reports access/transportation concerns to their pharmacy: Yes  - application to [employer] pending Patient reports adherence concerns with their medications:  No     Diabetes:  Current medications: Ozempic  0.25mg  on Tuesdays, Glimepiride  4mg  BID, Januvia  100mg  (patient stopped), says stomach issues have resolved since stopping, Lantus  20 units (has not started) Medications tried in the past: Jardiance, Metformin  Current glucose readings:  Using Accu Chek Guide Meter 7 day average 157 14 day average 143 30 day average 174  Checking at least once daily   Observed patterns:  Patient denies hypoglycemic s/sx including dizziness, shakiness, sweating. Patient denies hyperglycemic symptoms including polyuria, polydipsia, polyphagia, nocturia, neuropathy, blurred vision.   Current medication access support: Ozempic  through [facility], gave lantus  sample at patient home today  Hypertension:  Current medications: Triamterene -hydrochlorothiazide  37.5-25mg , Losartan  100mg , Hydralazine  100mg  QID (has only been taking 2 a day), Diltiazem  LA  120mg  Medications previously tried: Amlodipine   Patient has a validated, automated, upper arm home BP cuff Current blood pressure readings readings: had not been checking, machine needed new batteries and very old device  Patient denies hypotensive s/sx including dizziness, lightheadedness.  Patient denies hypertensive symptoms including headache, chest pain, shortness of breath   Objective:  Lab Results  Component Value Date   HGBA1C 10.0 (H) 03/22/2024    Lab Results  Component Value Date   CREATININE 0.69 03/13/2024   BUN 9 03/13/2024   NA 142 03/13/2024   K 4.1 03/13/2024   CL 107 (H) 03/13/2024   CO2 21 03/13/2024    Lab Results  Component Value Date   CHOL 131 03/13/2024   HDL 54 03/13/2024   LDLCALC 62 03/13/2024   TRIG 72 03/13/2024    Medications Reviewed Today     Reviewed by Lionell Jon DEL, RPH (Pharmacist) on 06/12/24 at 1539  Med List Status: <None>   Medication Order Taking? Sig Documenting Provider Last Dose Status Informant  Accu-Chek FastClix Lancets MISC [id number]  Use with device to check sugars three times daily Fernand Fredy RAMAN, MD  Active   albuterol  (PROVENTIL ) (2.5 MG/3ML) 0.083% nebulizer solution [id number]  Take 3 mLs (2.5 mg total) by nebulization every 4 (four) hours as needed for wheezing or shortness of breath. Scoggins, Amber, NP  Active   Albuterol -Budesonide (AIRSUPRA ) 90-80 MCG/ACT AERO [id number]  Inhale 1 puff into the lungs every 6 (six) hours as needed. Scoggins, Amber, NP  Active   aspirin EC 81 MG tablet [id number]  Take 81 mg by mouth daily. [provider]  Active Self  atorvastatin  (LIPITOR) 40 MG tablet [id number]  TAKE 1 TABLET BY MOUTH EVERY DAY Orlean Alan CHRISTELLA, FNP  Active   Cinnamon 500 MG TABS [id number]  Take 500 mg by mouth daily. [provider]  Active   Cranberry 500 MG TABS [id number]  Take 500 mg by mouth daily. [provider]  Active   diltiazem  (CARDIZEM  LA) 120 MG 24 hr tablet [id number]   Take 1 tablet (120 mg total) by mouth daily. Fernand Fredy RAMAN, MD  Active   fluticasone  (FLONASE ) 50 MCG/ACT nasal spray [id number]  Place 1 spray into both nostrils daily.  Patient not taking: Reported on 05/10/2024   Fernand Fredy RAMAN, MD  Expired 08/28/23 2359   glimepiride  (AMARYL ) 4 MG tablet [id number]  Take 1 tablet (4 mg total) by mouth 2 (two) times daily. Fernand Fredy RAMAN, MD  Active   glucose blood (ACCU-CHEK GUIDE TEST) test strip [id number]  Use with device to check sugars three times daily Fernand Fredy RAMAN, MD  Active   hydrALAZINE  (APRESOLINE ) 100 MG tablet [id number]  Take 1 tablet (100 mg total) by mouth 4 (four) times daily. Fernand Fredy RAMAN, MD  Active   insulin  glargine (LANTUS  SOLOSTAR) 100 UNIT/ML Solostar Pen [id number]  Inject 20 Units into the skin at bedtime. Fernand Fredy RAMAN, MD  Active   JANUVIA  100 MG tablet [id number]  TAKE 1 TABLET EVERY DAY Fernand Fredy RAMAN, MD  Active   levothyroxine  (SYNTHROID ) 137 MCG tablet [id number]  TAKE 1 TABLET BY MOUTH EVERYDAY IN THE MORNING ON AN EMPTY STOMACH, NO OTHER MEDS OR FOODS FOR AN HOUR Orlean Alan HERO, FNP  Active   losartan  (COZAAR ) 100 MG tablet [id number]  TAKE 1 TABLET EVERY DAY Orlean Alan HERO, FNP  Active   Multiple Vitamin (MULTI-VITAMIN) tablet [id number]  Take 1 tablet by mouth every other day. [provider]  Active   Semaglutide ,0.25 or 0.5MG /DOS, 2 MG/3ML SOPN [id number]  Inject 0.25 mg into the skin once a week. Fernand Fredy RAMAN, MD  Active   triamterene -hydrochlorothiazide  (MAXZIDE -25) 37.5-25 MG tablet [id number]  Take 1 tablet by mouth daily. Orlean Alan HERO, FNP  Active               Assessment/Plan:   Diabetes: - Currently uncontrolled - Reviewed long term cardiovascular and renal outcomes of uncontrolled blood sugar - Reviewed goal A1c, goal fasting, and goal 2 hour post prandial glucose - Reviewed dietary modifications including low carb diet - Recommend to continue current medication therapy. Check BG twice  daily. Plan next week to increase to Ozempic  0.5mg   - Patient denies personal or family history of multiple endocrine neoplasia type 2, medullary thyroid  cancer; personal history of pancreatitis or gallbladder disease.     Hypertension: - Currently uncontrolled - Reviewed long term cardiovascular and renal outcomes of uncontrolled blood pressure - Reviewed appropriate blood pressure monitoring technique and reviewed goal blood pressure. Recommended to check home blood pressure and heart rate 2 times daily and keep a log - Recommend to take hydralazine  and other medications as prescribed.     Follow Up Plan: 1 week  Jon VEAR Lindau, PharmD Clinical Pharmacist [phone]

## 2024-06-19 ENCOUNTER — Other Ambulatory Visit: Payer: Self-pay

## 2024-06-21 ENCOUNTER — Ambulatory Visit: Admitting: Internal Medicine

## 2024-06-27 DIAGNOSIS — E1165 Type 2 diabetes mellitus with hyperglycemia: Secondary | ICD-10-CM | POA: Diagnosis not present

## 2024-06-27 DIAGNOSIS — J4530 Mild persistent asthma, uncomplicated: Secondary | ICD-10-CM | POA: Diagnosis not present

## 2024-06-27 DIAGNOSIS — E1159 Type 2 diabetes mellitus with other circulatory complications: Secondary | ICD-10-CM | POA: Diagnosis not present

## 2024-06-27 DIAGNOSIS — D1720 Benign lipomatous neoplasm of skin and subcutaneous tissue of unspecified limb: Secondary | ICD-10-CM | POA: Diagnosis not present

## 2024-06-27 DIAGNOSIS — E1169 Type 2 diabetes mellitus with other specified complication: Secondary | ICD-10-CM | POA: Diagnosis not present

## 2024-06-27 DIAGNOSIS — Z1331 Encounter for screening for depression: Secondary | ICD-10-CM | POA: Diagnosis not present

## 2024-06-27 DIAGNOSIS — L659 Nonscarring hair loss, unspecified: Secondary | ICD-10-CM | POA: Diagnosis not present

## 2024-06-27 DIAGNOSIS — Z7689 Persons encountering health services in other specified circumstances: Secondary | ICD-10-CM | POA: Diagnosis not present

## 2024-06-27 DIAGNOSIS — E039 Hypothyroidism, unspecified: Secondary | ICD-10-CM | POA: Diagnosis not present

## 2024-07-03 ENCOUNTER — Other Ambulatory Visit: Payer: Self-pay

## 2024-07-10 ENCOUNTER — Other Ambulatory Visit: Payer: Self-pay

## 2024-07-10 ENCOUNTER — Telehealth: Payer: Self-pay

## 2024-07-10 NOTE — Progress Notes (Signed)
   07/10/2024  Patient ID: Erica Camacho, female   DOB: 1951-02-22, 73 y.o.   MRN: [id number]  Attempted to contact patient for scheduled appointment for medication management x 2 . Unable to leave voicemail at time of calls due to full voicemail.  Jon VEAR Lindau, PharmD Clinical Pharmacist [phone]

## 2024-07-12 DIAGNOSIS — E119 Type 2 diabetes mellitus without complications: Secondary | ICD-10-CM | POA: Diagnosis not present

## 2024-07-12 DIAGNOSIS — Z0100 Encounter for examination of eyes and vision without abnormal findings: Secondary | ICD-10-CM | POA: Diagnosis not present

## 2024-07-12 DIAGNOSIS — Z961 Presence of intraocular lens: Secondary | ICD-10-CM | POA: Diagnosis not present

## 2024-07-12 DIAGNOSIS — H538 Other visual disturbances: Secondary | ICD-10-CM | POA: Diagnosis not present

## 2024-07-12 DIAGNOSIS — H2511 Age-related nuclear cataract, right eye: Secondary | ICD-10-CM | POA: Diagnosis not present

## 2024-07-12 DIAGNOSIS — H35342 Macular cyst, hole, or pseudohole, left eye: Secondary | ICD-10-CM | POA: Diagnosis not present

## 2024-07-12 DIAGNOSIS — H35373 Puckering of macula, bilateral: Secondary | ICD-10-CM | POA: Diagnosis not present

## 2024-07-13 ENCOUNTER — Other Ambulatory Visit: Payer: Self-pay | Admitting: Family

## 2024-07-13 DIAGNOSIS — E1169 Type 2 diabetes mellitus with other specified complication: Secondary | ICD-10-CM

## 2024-07-25 ENCOUNTER — Other Ambulatory Visit: Payer: Self-pay | Admitting: Internal Medicine

## 2024-07-25 DIAGNOSIS — E1169 Type 2 diabetes mellitus with other specified complication: Secondary | ICD-10-CM

## 2024-08-10 ENCOUNTER — Ambulatory Visit: Admitting: Internal Medicine

## 2024-08-15 ENCOUNTER — Other Ambulatory Visit: Payer: Self-pay | Admitting: Family

## 2024-08-31 ENCOUNTER — Other Ambulatory Visit: Payer: Self-pay | Admitting: Family

## 2024-09-27 DIAGNOSIS — Z1383 Encounter for screening for respiratory disorder NEC: Secondary | ICD-10-CM | POA: Diagnosis not present

## 2024-09-27 DIAGNOSIS — Z1389 Encounter for screening for other disorder: Secondary | ICD-10-CM | POA: Diagnosis not present

## 2024-09-27 DIAGNOSIS — Z6835 Body mass index (BMI) 35.0-35.9, adult: Secondary | ICD-10-CM | POA: Diagnosis not present

## 2024-09-27 DIAGNOSIS — Z1231 Encounter for screening mammogram for malignant neoplasm of breast: Secondary | ICD-10-CM | POA: Diagnosis not present

## 2024-09-27 DIAGNOSIS — Z87891 Personal history of nicotine dependence: Secondary | ICD-10-CM | POA: Diagnosis not present

## 2024-09-27 DIAGNOSIS — E1169 Type 2 diabetes mellitus with other specified complication: Secondary | ICD-10-CM | POA: Diagnosis not present

## 2024-09-27 DIAGNOSIS — E1159 Type 2 diabetes mellitus with other circulatory complications: Secondary | ICD-10-CM | POA: Diagnosis not present

## 2024-09-27 DIAGNOSIS — Z0000 Encounter for general adult medical examination without abnormal findings: Secondary | ICD-10-CM | POA: Diagnosis not present

## 2024-09-27 DIAGNOSIS — Z1331 Encounter for screening for depression: Secondary | ICD-10-CM | POA: Diagnosis not present

## 2024-09-27 DIAGNOSIS — E039 Hypothyroidism, unspecified: Secondary | ICD-10-CM | POA: Diagnosis not present

## 2024-09-27 DIAGNOSIS — Z1339 Encounter for screening examination for other mental health and behavioral disorders: Secondary | ICD-10-CM | POA: Diagnosis not present

## 2024-09-27 DIAGNOSIS — E1165 Type 2 diabetes mellitus with hyperglycemia: Secondary | ICD-10-CM | POA: Diagnosis not present

## 2024-10-05 ENCOUNTER — Other Ambulatory Visit: Payer: Self-pay | Admitting: Family Medicine

## 2024-10-05 DIAGNOSIS — Z1231 Encounter for screening mammogram for malignant neoplasm of breast: Secondary | ICD-10-CM

## 2024-10-06 DIAGNOSIS — I509 Heart failure, unspecified: Secondary | ICD-10-CM | POA: Diagnosis not present

## 2024-10-06 DIAGNOSIS — Z833 Family history of diabetes mellitus: Secondary | ICD-10-CM | POA: Diagnosis not present

## 2024-10-06 DIAGNOSIS — I110 Hypertensive heart disease with heart failure: Secondary | ICD-10-CM | POA: Diagnosis not present

## 2024-10-06 DIAGNOSIS — E039 Hypothyroidism, unspecified: Secondary | ICD-10-CM | POA: Diagnosis not present

## 2024-10-06 DIAGNOSIS — E785 Hyperlipidemia, unspecified: Secondary | ICD-10-CM | POA: Diagnosis not present

## 2024-10-06 DIAGNOSIS — J4489 Other specified chronic obstructive pulmonary disease: Secondary | ICD-10-CM | POA: Diagnosis not present

## 2024-10-06 DIAGNOSIS — Z7982 Long term (current) use of aspirin: Secondary | ICD-10-CM | POA: Diagnosis not present

## 2024-10-06 DIAGNOSIS — Z8249 Family history of ischemic heart disease and other diseases of the circulatory system: Secondary | ICD-10-CM | POA: Diagnosis not present

## 2024-10-18 ENCOUNTER — Other Ambulatory Visit: Payer: Self-pay | Admitting: Internal Medicine

## 2024-10-18 DIAGNOSIS — E1165 Type 2 diabetes mellitus with hyperglycemia: Secondary | ICD-10-CM

## 2024-12-06 ENCOUNTER — Other Ambulatory Visit: Payer: Self-pay | Admitting: Internal Medicine

## 2024-12-06 DIAGNOSIS — E1165 Type 2 diabetes mellitus with hyperglycemia: Secondary | ICD-10-CM
